# Patient Record
Sex: Female | Born: 1990 | Race: White | Hispanic: No | Marital: Married | Smoking: Former smoker
Health system: Southern US, Community
[De-identification: ages and names within clinical notes are randomized; demographics above are authoritative.]

## PROBLEM LIST (undated history)

## (undated) ENCOUNTER — Inpatient Hospital Stay: Payer: Self-pay

## (undated) DIAGNOSIS — F331 Major depressive disorder, recurrent, moderate: Secondary | ICD-10-CM

## (undated) DIAGNOSIS — U071 COVID-19: Secondary | ICD-10-CM

## (undated) DIAGNOSIS — Z86718 Personal history of other venous thrombosis and embolism: Secondary | ICD-10-CM

## (undated) DIAGNOSIS — A630 Anogenital (venereal) warts: Secondary | ICD-10-CM

## (undated) DIAGNOSIS — C801 Malignant (primary) neoplasm, unspecified: Secondary | ICD-10-CM

## (undated) DIAGNOSIS — F17200 Nicotine dependence, unspecified, uncomplicated: Secondary | ICD-10-CM

## (undated) DIAGNOSIS — Z3490 Encounter for supervision of normal pregnancy, unspecified, unspecified trimester: Secondary | ICD-10-CM

## (undated) DIAGNOSIS — J45909 Unspecified asthma, uncomplicated: Secondary | ICD-10-CM

## (undated) DIAGNOSIS — IMO0001 Reserved for inherently not codable concepts without codable children: Secondary | ICD-10-CM

## (undated) DIAGNOSIS — G43909 Migraine, unspecified, not intractable, without status migrainosus: Secondary | ICD-10-CM

## (undated) HISTORY — DX: Migraine, unspecified, not intractable, without status migrainosus: G43.909

## (undated) HISTORY — DX: Reserved for inherently not codable concepts without codable children: IMO0001

## (undated) HISTORY — DX: Nicotine dependence, unspecified, uncomplicated: F17.200

## (undated) HISTORY — DX: Personal history of other venous thrombosis and embolism: Z86.718

## (undated) HISTORY — DX: Unspecified asthma, uncomplicated: J45.909

## (undated) HISTORY — PX: WISDOM TOOTH EXTRACTION: SHX21

## (undated) HISTORY — DX: Anogenital (venereal) warts: A63.0

## (undated) HISTORY — DX: Major depressive disorder, recurrent, moderate: F33.1

---
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
## 2012-05-31 ENCOUNTER — Ambulatory Visit: Payer: Self-pay | Admitting: Medical

## 2012-06-25 ENCOUNTER — Ambulatory Visit: Payer: Self-pay | Admitting: Emergency Medicine

## 2012-06-25 LAB — URINALYSIS, COMPLETE
Glucose,UR: NEGATIVE mg/dL (ref 0–75)
Nitrite: POSITIVE
Ph: 6 (ref 4.5–8.0)
Protein: 30

## 2012-06-26 LAB — URINE CULTURE

## 2012-09-27 HISTORY — PX: SPLENECTOMY: SUR1306

## 2012-10-21 ENCOUNTER — Ambulatory Visit: Payer: Self-pay

## 2012-11-27 HISTORY — PX: MELANOMA EXCISION: SHX5266

## 2012-12-03 ENCOUNTER — Ambulatory Visit: Payer: Self-pay | Admitting: Internal Medicine

## 2013-08-05 ENCOUNTER — Observation Stay: Payer: Self-pay

## 2013-08-05 LAB — URINALYSIS, COMPLETE
Nitrite: NEGATIVE
Ph: 6 (ref 4.5–8.0)
Protein: 30
Squamous Epithelial: 18
WBC UR: 22 /HPF (ref 0–5)

## 2013-08-05 LAB — FETAL FIBRONECTIN: Fetal Fibronectin: POSITIVE

## 2013-08-06 ENCOUNTER — Inpatient Hospital Stay: Payer: Self-pay | Admitting: Obstetrics and Gynecology

## 2013-08-06 LAB — URINALYSIS, COMPLETE
Nitrite: NEGATIVE
Ph: 6 (ref 4.5–8.0)
Specific Gravity: 1.023 (ref 1.003–1.030)
Squamous Epithelial: 4
WBC UR: 3 /HPF (ref 0–5)

## 2013-08-06 LAB — CBC WITH DIFFERENTIAL/PLATELET
Basophil #: 0.1 10*3/uL (ref 0.0–0.1)
Basophil %: 0.3 %
Eosinophil %: 1.7 %
HCT: 38.7 % (ref 35.0–47.0)
HGB: 13.3 g/dL (ref 12.0–16.0)
Lymphocyte #: 2.9 10*3/uL (ref 1.0–3.6)
MCH: 30.3 pg (ref 26.0–34.0)
MCV: 88 fL (ref 80–100)
Neutrophil #: 15.6 10*3/uL — ABNORMAL HIGH (ref 1.4–6.5)
Neutrophil %: 76.4 %
RBC: 4.41 10*6/uL (ref 3.80–5.20)

## 2013-08-06 LAB — BASIC METABOLIC PANEL
BUN: 5 mg/dL — ABNORMAL LOW (ref 7–18)
Calcium, Total: 8.7 mg/dL (ref 8.5–10.1)
Creatinine: 0.63 mg/dL (ref 0.60–1.30)
EGFR (African American): >60
Glucose: 93 mg/dL (ref 65–99)
Osmolality: 271 (ref 275–301)
Potassium: 3.5 mmol/L (ref 3.5–5.1)
Sodium: 137 mmol/L (ref 136–145)

## 2013-08-07 LAB — MAGNESIUM: Magnesium: 5.7 mg/dL — ABNORMAL HIGH

## 2013-08-08 LAB — BETA STREP CULTURE(ARMC)

## 2013-09-04 ENCOUNTER — Observation Stay: Payer: Self-pay | Admitting: Obstetrics and Gynecology

## 2013-09-04 LAB — URINALYSIS, COMPLETE
Bacteria: NONE SEEN
Blood: NEGATIVE
Glucose,UR: NEGATIVE mg/dL (ref 0–75)
Nitrite: NEGATIVE
Protein: 100
RBC,UR: 1 /HPF (ref 0–5)
Specific Gravity: 1.03 (ref 1.003–1.030)
Squamous Epithelial: 11
WBC UR: 14 /HPF (ref 0–5)

## 2013-09-18 ENCOUNTER — Observation Stay: Payer: Self-pay

## 2013-09-22 ENCOUNTER — Observation Stay: Payer: Self-pay | Admitting: Advanced Practice Midwife

## 2013-09-25 ENCOUNTER — Inpatient Hospital Stay: Payer: Self-pay | Admitting: Obstetrics and Gynecology

## 2013-09-25 LAB — CBC WITH DIFFERENTIAL/PLATELET
Basophil #: 0.1 10*3/uL (ref 0.0–0.1)
Eosinophil #: 0.1 10*3/uL (ref 0.0–0.7)
HGB: 14 g/dL (ref 12.0–16.0)
Lymphocyte %: 20.9 %
MCV: 86 fL (ref 80–100)
Monocyte %: 6.6 %
Neutrophil %: 71.4 %
Platelet: 262 10*3/uL (ref 150–440)
RBC: 4.73 10*6/uL (ref 3.80–5.20)
RDW: 13.3 % (ref 11.5–14.5)
WBC: 17.2 10*3/uL — ABNORMAL HIGH (ref 3.6–11.0)

## 2013-09-26 LAB — CREATININE, SERUM: EGFR (Non-African Amer.): >60

## 2013-09-30 ENCOUNTER — Ambulatory Visit: Payer: Self-pay | Admitting: Pediatrics

## 2013-11-07 LAB — HM PAP SMEAR

## 2014-01-20 ENCOUNTER — Ambulatory Visit: Payer: Self-pay | Admitting: Emergency Medicine

## 2014-01-27 ENCOUNTER — Ambulatory Visit: Payer: Self-pay | Admitting: Neurology

## 2014-04-22 ENCOUNTER — Ambulatory Visit: Payer: Self-pay | Admitting: Physician Assistant

## 2014-04-27 ENCOUNTER — Encounter: Payer: Self-pay | Admitting: Surgery

## 2014-12-15 ENCOUNTER — Ambulatory Visit: Payer: Self-pay | Admitting: Internal Medicine

## 2015-01-12 LAB — LIPID PANEL: HDL: 59 mg/dL (ref 35–70)

## 2015-01-22 LAB — BASIC METABOLIC PANEL
BUN: 11 mg/dL (ref 4–21)
Creatinine: 0.8 mg/dL (ref <=1.1)

## 2015-01-22 LAB — TSH: TSH: 1.36 u[IU]/mL (ref <=5.90)

## 2015-01-22 LAB — LIPID PANEL
CHOLESTEROL: 188 mg/dL (ref 0–200)
Triglycerides: 109 mg/dL (ref 40–160)
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
## 2015-05-04 ENCOUNTER — Other Ambulatory Visit: Payer: Self-pay

## 2015-05-04 DIAGNOSIS — G43009 Migraine without aura, not intractable, without status migrainosus: Secondary | ICD-10-CM | POA: Insufficient documentation

## 2015-05-04 DIAGNOSIS — R102 Pelvic and perineal pain: Secondary | ICD-10-CM | POA: Insufficient documentation

## 2015-05-04 DIAGNOSIS — Z86718 Personal history of other venous thrombosis and embolism: Secondary | ICD-10-CM | POA: Insufficient documentation

## 2015-05-04 DIAGNOSIS — C4361 Malignant melanoma of right upper limb, including shoulder: Secondary | ICD-10-CM | POA: Insufficient documentation

## 2015-05-04 DIAGNOSIS — R002 Palpitations: Secondary | ICD-10-CM | POA: Insufficient documentation

## 2015-05-04 DIAGNOSIS — K439 Ventral hernia without obstruction or gangrene: Secondary | ICD-10-CM | POA: Insufficient documentation

## 2015-05-04 MED ORDER — NORGESTIMATE-ETH ESTRADIOL 0.25-35 MG-MCG PO TABS: 1.0000 | ORAL_TABLET | Freq: Every day | ORAL | Status: DC

## 2015-12-15 ENCOUNTER — Other Ambulatory Visit: Payer: Self-pay

## 2015-12-15 MED ORDER — NORGESTIMATE-ETH ESTRADIOL 0.25-35 MG-MCG PO TABS: 1.0000 | ORAL_TABLET | Freq: Every day | ORAL | Status: DC

## 2015-12-15 NOTE — Telephone Encounter (Signed)
Received fax from pharmacy, requesting medication

## 2016-03-13 ENCOUNTER — Encounter: Payer: Self-pay | Admitting: Internal Medicine

## 2016-03-13 ENCOUNTER — Ambulatory Visit (INDEPENDENT_AMBULATORY_CARE_PROVIDER_SITE_OTHER): Payer: BLUE CROSS/BLUE SHIELD | Admitting: Internal Medicine

## 2016-03-13 VITALS — BP 116/78 | HR 80 | Ht 69.0 in | Wt 201.0 lb

## 2016-03-13 DIAGNOSIS — K629 Disease of anus and rectum, unspecified: Secondary | ICD-10-CM | POA: Diagnosis not present

## 2016-03-13 DIAGNOSIS — K602 Anal fissure, unspecified: Secondary | ICD-10-CM

## 2016-03-13 MED ORDER — HYDROCORTISONE 2.5 % RE CREA: 1.0000 "application " | TOPICAL_CREAM | Freq: Four times a day (QID) | RECTAL | Status: DC
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
## 2016-03-13 NOTE — Patient Instructions (Signed)
Continue Stool Softener 3-4 times per week. Call if no improvement in one week.

## 2016-04-18 ENCOUNTER — Ambulatory Visit (INDEPENDENT_AMBULATORY_CARE_PROVIDER_SITE_OTHER): Payer: BLUE CROSS/BLUE SHIELD | Admitting: Internal Medicine

## 2016-04-18 ENCOUNTER — Encounter: Payer: Self-pay | Admitting: Internal Medicine

## 2016-04-18 VITALS — BP 108/82 | HR 75 | Resp 16 | Ht 69.0 in | Wt 198.0 lb

## 2016-04-18 DIAGNOSIS — F17200 Nicotine dependence, unspecified, uncomplicated: Secondary | ICD-10-CM | POA: Insufficient documentation

## 2016-04-18 DIAGNOSIS — K439 Ventral hernia without obstruction or gangrene: Secondary | ICD-10-CM | POA: Diagnosis not present

## 2016-04-18 DIAGNOSIS — G43009 Migraine without aura, not intractable, without status migrainosus: Secondary | ICD-10-CM

## 2016-04-18 DIAGNOSIS — Z0000 Encounter for general adult medical examination without abnormal findings: Secondary | ICD-10-CM

## 2016-04-18 DIAGNOSIS — C4361 Malignant melanoma of right upper limb, including shoulder: Secondary | ICD-10-CM | POA: Diagnosis not present

## 2016-04-18 DIAGNOSIS — Z124 Encounter for screening for malignant neoplasm of cervix: Secondary | ICD-10-CM | POA: Diagnosis not present

## 2016-04-18 DIAGNOSIS — Z720 Tobacco use: Secondary | ICD-10-CM | POA: Diagnosis not present

## 2016-04-18 LAB — POCT URINALYSIS DIPSTICK
BILIRUBIN UA: NEGATIVE
GLUCOSE UA: NEGATIVE
Ketones, UA: NEGATIVE
Leukocytes, UA: NEGATIVE
NITRITE UA: NEGATIVE
PH UA: 6
Protein, UA: NEGATIVE
SPEC GRAV UA: 1.01
Urobilinogen, UA: 0.2
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
## 2016-04-18 NOTE — Patient Instructions (Signed)
Breast Self-Awareness Practicing breast self-awareness may pick up problems early, prevent significant medical complications, and possibly save your life. By practicing breast self-awareness, you can become familiar with how your breasts look and feel and if your breasts are changing. This allows you to notice changes early. It can also offer you some reassurance that your breast health is good. One way to learn what is normal for your breasts and whether your breasts are changing is to do a breast self-exam. If you find a lump or something that was not present in the past, it is best to contact your caregiver right away. Other findings that should be evaluated by your caregiver include nipple discharge, especially if it is bloody; skin changes or reddening; areas where the skin seems to be pulled in (retracted); or new lumps and bumps. Breast pain is seldom associated with cancer (malignancy), but should also be evaluated by a caregiver. HOW TO PERFORM A BREAST SELF-EXAM The best time to examine your breasts is 5-7 days after your menstrual period is over. During menstruation, the breasts are lumpier, and it may be more difficult to pick up changes. If you do not menstruate, have reached menopause, or had your uterus removed (hysterectomy), you should examine your breasts at regular intervals, such as monthly. If you are breastfeeding, examine your breasts after a feeding or after using a breast pump. Breast implants do not decrease the risk for lumps or tumors, so continue to perform breast self-exams as recommended. Talk to your caregiver about how to determine the difference between the implant and breast tissue. Also, talk about the amount of pressure you should use during the exam. Over time, you will become more familiar with the variations of your breasts and more comfortable with the exam. A breast self-exam requires you to remove all your clothes above the waist. 1. Look at your breasts and nipples.  Stand in front of a mirror in a room with good lighting. With your hands on your hips, push your hands firmly downward. Look for a difference in shape, contour, and size from one breast to the other (asymmetry). Asymmetry includes puckers, dips, or bumps. Also, look for skin changes, such as reddened or scaly areas on the breasts. Look for nipple changes, such as discharge, dimpling, repositioning, or redness. 2. Carefully feel your breasts. This is best done either in the shower or tub while using soapy water or when flat on your back. Place the arm (on the side of the breast you are examining) above your head. Use the pads (not the fingertips) of your three middle fingers on your opposite hand to feel your breasts. Start in the underarm area and use  inch (2 cm) overlapping circles to feel your breast. Use 3 different levels of pressure (light, medium, and firm pressure) at each circle before moving to the next circle. The light pressure is needed to feel the tissue closest to the skin. The medium pressure will help to feel breast tissue a little deeper, while the firm pressure is needed to feel the tissue close to the ribs. Continue the overlapping circles, moving downward over the breast until you feel your ribs below your breast. Then, move one finger-width towards the center of the body. Continue to use the  inch (2 cm) overlapping circles to feel your breast as you move slowly up toward the collar bone (clavicle) near the base of the neck. Continue the up and down exam using all 3 pressures until you reach the   middle of the chest. Do this with each breast, carefully feeling for lumps or changes. 3.  Keep a written record with breast changes or normal findings for each breast. By writing this information down, you do not need to depend only on memory for size, tenderness, or location. Write down where you are in your menstrual cycle, if you are still menstruating. Breast tissue can have some lumps or  thick tissue. However, see your caregiver if you find anything that concerns you.  SEEK MEDICAL CARE IF:  You see a change in shape, contour, or size of your breasts or nipples.   You see skin changes, such as reddened or scaly areas on the breasts or nipples.   You have an unusual discharge from your nipples.   You feel a new lump or unusually thick areas.    This information is not intended to replace advice given to you by your health care provider. Make sure you discuss any questions you have with your health care provider.   Document Released: 11/13/2005 Document Revised: 10/30/2012 Document Reviewed: 02/28/2012 Elsevier Interactive Patient Education 2016 [employer]  

## 2016-04-19 LAB — COMPREHENSIVE METABOLIC PANEL
A/G RATIO: 1.6 (ref 1.2–2.2)
ALT: 11 IU/L (ref 0–32)
AST: 16 IU/L (ref 0–40)
Albumin: 4.3 g/dL (ref 3.5–5.5)
Alkaline Phosphatase: 85 IU/L (ref 39–117)
BILIRUBIN TOTAL: 0.2 mg/dL (ref 0.0–1.2)
BUN/Creatinine Ratio: 16 (ref 9–23)
BUN: 12 mg/dL (ref 6–20)
CHLORIDE: 97 mmol/L (ref 96–106)
CO2: 23 mmol/L (ref 18–29)
Calcium: 9.4 mg/dL (ref 8.7–10.2)
Creatinine, Ser: 0.75 mg/dL (ref 0.57–1.00)
GFR calc Af Amer: 130 mL/min/{1.73_m2} (ref >59)
GFR calc non Af Amer: 113 mL/min/{1.73_m2} (ref >59)
GLUCOSE: 70 mg/dL (ref 65–99)
Globulin, Total: 2.7 g/dL (ref 1.5–4.5)
POTASSIUM: 4.1 mmol/L (ref 3.5–5.2)
Sodium: 137 mmol/L (ref 134–144)
Total Protein: 7 g/dL (ref 6.0–8.5)

## 2016-04-19 LAB — LIPID PANEL
Chol/HDL Ratio: 4.5 ratio units — ABNORMAL HIGH (ref 0.0–4.4)
Cholesterol, Total: 185 mg/dL (ref 100–199)
HDL: 41 mg/dL (ref >39)
LDL Calculated: 90 mg/dL (ref 0–99)
Triglycerides: 271 mg/dL — ABNORMAL HIGH (ref 0–149)
VLDL Cholesterol Cal: 54 mg/dL — ABNORMAL HIGH (ref 5–40)

## 2016-04-19 LAB — CBC WITH DIFFERENTIAL/PLATELET
Basophils Absolute: 0.1 10*3/uL (ref 0.0–0.2)
Basos: 0 %
EOS (ABSOLUTE): 0.6 10*3/uL — ABNORMAL HIGH (ref 0.0–0.4)
Eos: 5 %
Hematocrit: 42.6 % (ref 34.0–46.6)
Hemoglobin: 14.3 g/dL (ref 11.1–15.9)
Immature Grans (Abs): 0 10*3/uL (ref 0.0–0.1)
Immature Granulocytes: 0 %
Lymphocytes Absolute: 4.2 10*3/uL — ABNORMAL HIGH (ref 0.7–3.1)
Lymphs: 36 %
MCH: 28.4 pg (ref 26.6–33.0)
MCHC: 33.6 g/dL (ref 31.5–35.7)
MCV: 85 fL (ref 79–97)
Monocytes Absolute: 0.9 10*3/uL (ref 0.1–0.9)
Monocytes: 8 %
Neutrophils Absolute: 5.9 10*3/uL (ref 1.4–7.0)
Neutrophils: 51 %
Platelets: 351 10*3/uL (ref 150–379)
RBC: 5.04 x10E6/uL (ref 3.77–5.28)
RDW: 14.5 % (ref 12.3–15.4)
WBC: 11.8 10*3/uL — ABNORMAL HIGH (ref 3.4–10.8)

## 2016-04-19 LAB — TSH: TSH: 1.38 u[IU]/mL (ref 0.450–4.500)

## 2016-04-20 LAB — PAP IG AND HPV HIGH-RISK
HPV, high-risk: NEGATIVE
PAP Smear Comment: 0

## 2016-05-12 ENCOUNTER — Telehealth: Payer: Self-pay

## 2016-05-12 NOTE — Telephone Encounter (Signed)
These 2 types of OCPs have exactly the same composition. I do not think changing brand would have any effect of current issues.  I suggest consultation with GYN if problems are persistent.

## 2016-05-12 NOTE — Telephone Encounter (Signed)
Patient having periods every other week and sometimes blood is dark brown as if old blood. Patient swapped from [facility] to [facility].  Advised on VM to call back and get girls to schedule OV and if any chills or abdominal pain or anything else arises go to [place] or ER.

## 2016-05-16 NOTE — Telephone Encounter (Signed)
Advised 

## 2016-05-17 ENCOUNTER — Ambulatory Visit: Payer: BLUE CROSS/BLUE SHIELD | Admitting: Internal Medicine

## 2016-08-28 ENCOUNTER — Encounter: Payer: Self-pay | Admitting: Emergency Medicine

## 2016-08-28 ENCOUNTER — Ambulatory Visit
Admission: EM | Admit: 2016-08-28 | Discharge: 2016-08-28 | Disposition: A | Discharge status: Home / Self Care | Payer: BLUE CROSS/BLUE SHIELD | Attending: Family Medicine | Admitting: Family Medicine

## 2016-08-28 DIAGNOSIS — Z3201 Encounter for pregnancy test, result positive: Secondary | ICD-10-CM

## 2016-08-28 LAB — PREGNANCY, URINE: PREG TEST UR: POSITIVE — AB

## 2016-08-28 NOTE — ED Triage Notes (Signed)
Patient had pregnancy test at home positive.  Patient here for confirmation.

## 2016-08-28 NOTE — Discharge Instructions (Signed)
Began taking prenatal vitamins.  Call today to schedule a GYN follow-up with your [facility].  Follow up with your primary care physician this week as needed. Return to Urgent care for new or worsening concerns.
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
## 2016-09-02 ENCOUNTER — Observation Stay
Admission: EM | Admit: 2016-09-02 | Discharge: 2016-09-03 | Disposition: A | Discharge status: Home / Self Care | Payer: BLUE CROSS/BLUE SHIELD | Attending: Specialist | Admitting: Specialist

## 2016-09-02 ENCOUNTER — Encounter: Payer: Self-pay | Admitting: Emergency Medicine

## 2016-09-02 ENCOUNTER — Emergency Department: Payer: BLUE CROSS/BLUE SHIELD

## 2016-09-02 DIAGNOSIS — G43009 Migraine without aura, not intractable, without status migrainosus: Secondary | ICD-10-CM | POA: Diagnosis not present

## 2016-09-02 DIAGNOSIS — Z3A01 Less than 8 weeks gestation of pregnancy: Secondary | ICD-10-CM | POA: Insufficient documentation

## 2016-09-02 DIAGNOSIS — Z888 Allergy status to other drugs, medicaments and biological substances status: Secondary | ICD-10-CM | POA: Insufficient documentation

## 2016-09-02 DIAGNOSIS — Z86718 Personal history of other venous thrombosis and embolism: Secondary | ICD-10-CM | POA: Diagnosis not present

## 2016-09-02 DIAGNOSIS — F17210 Nicotine dependence, cigarettes, uncomplicated: Secondary | ICD-10-CM | POA: Insufficient documentation

## 2016-09-02 DIAGNOSIS — Z85820 Personal history of malignant melanoma of skin: Secondary | ICD-10-CM | POA: Diagnosis not present

## 2016-09-02 DIAGNOSIS — O99331 Smoking (tobacco) complicating pregnancy, first trimester: Secondary | ICD-10-CM | POA: Insufficient documentation

## 2016-09-02 DIAGNOSIS — O99351 Diseases of the nervous system complicating pregnancy, first trimester: Secondary | ICD-10-CM | POA: Diagnosis not present

## 2016-09-02 DIAGNOSIS — H539 Unspecified visual disturbance: Secondary | ICD-10-CM

## 2016-09-02 DIAGNOSIS — Z3490 Encounter for supervision of normal pregnancy, unspecified, unspecified trimester: Secondary | ICD-10-CM

## 2016-09-02 HISTORY — DX: Encounter for supervision of normal pregnancy, unspecified, unspecified trimester: Z34.90

## 2016-09-02 LAB — COMPREHENSIVE METABOLIC PANEL
ALT: 17 U/L (ref 14–54)
AST: 23 U/L (ref 15–41)
Albumin: 3.7 g/dL (ref 3.5–5.0)
Alkaline Phosphatase: 85 U/L (ref 38–126)
Anion gap: 7 (ref 5–15)
BUN: 11 mg/dL (ref 6–20)
CHLORIDE: 105 mmol/L (ref 101–111)
CO2: 22 mmol/L (ref 22–32)
CREATININE: 0.69 mg/dL (ref 0.44–1.00)
Calcium: 9 mg/dL (ref 8.9–10.3)
GFR calc non Af Amer: >60 mL/min (ref >60)
Glucose, Bld: 89 mg/dL (ref 65–99)
Potassium: 3.6 mmol/L (ref 3.5–5.1)
SODIUM: 134 mmol/L — AB (ref 135–145)
Total Bilirubin: 0.6 mg/dL (ref 0.3–1.2)
Total Protein: 7.3 g/dL (ref 6.5–8.1)

## 2016-09-02 LAB — TSH: TSH: 1.881 u[IU]/mL (ref 0.350–4.500)

## 2016-09-02 LAB — URINALYSIS COMPLETE WITH MICROSCOPIC (ARMC ONLY)
BACTERIA UA: NONE SEEN
Bilirubin Urine: NEGATIVE
Glucose, UA: NEGATIVE mg/dL
HGB URINE DIPSTICK: NEGATIVE
Ketones, ur: NEGATIVE mg/dL
LEUKOCYTES UA: NEGATIVE
NITRITE: NEGATIVE
Protein, ur: NEGATIVE mg/dL
SPECIFIC GRAVITY, URINE: 1.017 (ref 1.005–1.030)
pH: 6 (ref 5.0–8.0)

## 2016-09-02 LAB — ANTITHROMBIN III: AntiThromb III Func: 85 % (ref 75–120)

## 2016-09-02 LAB — DIFFERENTIAL
BASOS ABS: 0.2 10*3/uL — AB (ref 0–0.1)
BASOS PCT: 2 %
Eosinophils Absolute: 0.6 10*3/uL (ref 0–0.7)
Eosinophils Relative: 5 %
Lymphocytes Relative: 33 %
Lymphs Abs: 4 10*3/uL — ABNORMAL HIGH (ref 1.0–3.6)
MONO ABS: 2 10*3/uL — AB (ref 0.2–0.9)
Monocytes Relative: 17 %
NEUTROS ABS: 5.4 10*3/uL (ref 1.4–6.5)
Neutrophils Relative %: 43 %

## 2016-09-02 LAB — CBC
HCT: 42.4 % (ref 35.0–47.0)
Hemoglobin: 14.8 g/dL (ref 12.0–16.0)
MCH: 28.8 pg (ref 26.0–34.0)
MCHC: 34.9 g/dL (ref 32.0–36.0)
MCV: 82.5 fL (ref 80.0–100.0)
PLATELETS: 335 10*3/uL (ref 150–440)
RBC: 5.14 MIL/uL (ref 3.80–5.20)
RDW: 14.1 % (ref 11.5–14.5)
WBC: 12.4 10*3/uL — AB (ref 3.6–11.0)

## 2016-09-02 LAB — PROTIME-INR
INR: 1.03
PROTHROMBIN TIME: 13.5 s (ref 11.4–15.2)

## 2016-09-02 LAB — APTT: APTT: 30 s (ref 24–36)

## 2016-09-02 LAB — GLUCOSE, CAPILLARY: Glucose-Capillary: 88 mg/dL (ref 65–99)

## 2016-09-02 LAB — HCG, QUANTITATIVE, PREGNANCY: hCG, Beta Chain, Quant, S: 6915 m[IU]/mL — ABNORMAL HIGH (ref <5)

## 2016-09-02 MED ORDER — ALBUTEROL SULFATE (2.5 MG/3ML) 0.083% IN NEBU: 3.0000 mL | INHALATION_SOLUTION | RESPIRATORY_TRACT | Status: DC | PRN

## 2016-09-02 MED ORDER — MORPHINE SULFATE (PF) 2 MG/ML IV SOLN
2.0000 mg | Freq: Once | INTRAVENOUS | Status: AC
  Administered 2016-09-02: 2 mg via INTRAVENOUS
  Filled 2016-09-02: qty 1

## 2016-09-02 MED ORDER — ONDANSETRON HCL 4 MG/2ML IJ SOLN
4.0000 mg | Freq: Once | INTRAMUSCULAR | Status: AC
  Administered 2016-09-02: 4 mg via INTRAVENOUS
  Filled 2016-09-02: qty 2

## 2016-09-02 MED ORDER — SODIUM CHLORIDE 0.9 % IV SOLN
INTRAVENOUS | Status: DC
  Administered 2016-09-02 – 2016-09-03 (×2): via INTRAVENOUS

## 2016-09-02 MED ORDER — MORPHINE SULFATE (PF) 2 MG/ML IV SOLN: 1.0000 mg | INTRAVENOUS | Status: DC | PRN

## 2016-09-02 MED ORDER — ACETAMINOPHEN 325 MG PO TABS: 650.0000 mg | ORAL_TABLET | Freq: Four times a day (QID) | ORAL | Status: DC | PRN

## 2016-09-02 MED ORDER — ACETAMINOPHEN 650 MG RE SUPP: 650.0000 mg | Freq: Four times a day (QID) | RECTAL | Status: DC | PRN

## 2016-09-02 MED ORDER — SODIUM CHLORIDE 0.9% FLUSH: 3.0000 mL | Freq: Two times a day (BID) | INTRAVENOUS | Status: DC

## 2016-09-02 MED ORDER — SODIUM CHLORIDE 0.9 % IV BOLUS (SEPSIS)
1000.0000 mL | Freq: Once | INTRAVENOUS | Status: AC
  Administered 2016-09-02: 1000 mL via INTRAVENOUS

## 2016-09-02 MED ORDER — PRENATAL MULTIVITAMIN CH
1.0000 | ORAL_TABLET | Freq: Every day | ORAL | Status: DC
  Administered 2016-09-03: 12:00:00 1 via ORAL
  Filled 2016-09-02: qty 1

## 2016-09-02 NOTE — ED Triage Notes (Addendum)
Pt reports 1 HR ago loss of right visual field from right eye. Reports vision is completely gone. Vision did return but then went away again. Pt reports [redacted] wks pregnant. Mild pain to left side of head.

## 2016-09-02 NOTE — ED Notes (Signed)
Report called to floor, given to [facility], Therapist, sports.

## 2016-09-02 NOTE — ED Triage Notes (Signed)
CT head ordered per dr Archie Balboa
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
## 2016-09-02 NOTE — ED Triage Notes (Signed)
Per CT, MD must go over risk of CT with pt prior to scan.  Will go over once in room per MD

## 2016-09-03 ENCOUNTER — Observation Stay: Payer: BLUE CROSS/BLUE SHIELD

## 2016-09-03 ENCOUNTER — Encounter: Payer: Self-pay | Admitting: Internal Medicine

## 2016-09-03 DIAGNOSIS — Z3490 Encounter for supervision of normal pregnancy, unspecified, unspecified trimester: Secondary | ICD-10-CM

## 2016-09-03 DIAGNOSIS — H539 Unspecified visual disturbance: Secondary | ICD-10-CM

## 2016-09-03 LAB — CBC
HCT: 37.1 % (ref 35.0–47.0)
Hemoglobin: 12.8 g/dL (ref 12.0–16.0)
MCH: 28.6 pg (ref 26.0–34.0)
MCHC: 34.6 g/dL (ref 32.0–36.0)
MCV: 82.6 fL (ref 80.0–100.0)
PLATELETS: 302 10*3/uL (ref 150–440)
RBC: 4.49 MIL/uL (ref 3.80–5.20)
RDW: 14.2 % (ref 11.5–14.5)
WBC: 13.6 10*3/uL — AB (ref 3.6–11.0)

## 2016-09-03 LAB — BASIC METABOLIC PANEL
Anion gap: 6 (ref 5–15)
BUN: 10 mg/dL (ref 6–20)
CO2: 22 mmol/L (ref 22–32)
CREATININE: 0.73 mg/dL (ref 0.44–1.00)
Calcium: 8.1 mg/dL — ABNORMAL LOW (ref 8.9–10.3)
Chloride: 107 mmol/L (ref 101–111)
GFR calc Af Amer: >60 mL/min (ref >60)
Glucose, Bld: 91 mg/dL (ref 65–99)
Potassium: 3.7 mmol/L (ref 3.5–5.1)
SODIUM: 135 mmol/L (ref 135–145)

## 2016-09-03 MED ORDER — GUAIFENESIN-DM 100-10 MG/5ML PO SYRP
5.0000 mL | ORAL_SOLUTION | Freq: Four times a day (QID) | ORAL | Status: DC | PRN
  Administered 2016-09-03 (×2): 5 mL via ORAL
  Filled 2016-09-03 (×2): qty 5

## 2016-09-03 NOTE — Progress Notes (Signed)
17:38 - Patient discharged - all instructions reviewed. Wheeled to door with husband.
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
## 2016-09-03 NOTE — Progress Notes (Signed)
Spoke to dr Marcille Blanco about patient blood pressure 81/50 md order to monitor no orders at this time

## 2016-09-03 NOTE — Progress Notes (Signed)
1700: Patient had MRI of head. Dr. Verdell Carmine called to review results. Patient may be discharged.

## 2016-09-04 LAB — HOMOCYSTEINE: HOMOCYSTEINE-NORM: 5.9 umol/L (ref 0.0–15.0)

## 2016-09-04 NOTE — Discharge Summary (Signed)
[place] [facility] [place] NAME: Erika Barnes    MR#:  [id number]  DATE OF BIRTH:  [birth year]  DATE OF ADMISSION:  09/02/2016 ADMITTING PHYSICIAN: Harvie Bridge, DO  DATE OF DISCHARGE: 09/03/2016  5:39 PM  PRIMARY CARE PHYSICIAN: Halina Maidens, MD    ADMISSION DIAGNOSIS:  Vision changes [H53.9]  DISCHARGE DIAGNOSIS:  Active Problems:   Vision abnormalities   Pregnancy   SECONDARY DIAGNOSIS:   Past Medical History:  Diagnosis Date  . Birth control   . Migraines   . Pregnant   . Smoker    NCQUIT Info given 04/18/2016    HOSPITAL COURSE:   44 female with past medical history of migraines presented to the hospital due to vision loss and speech disturbance and some right-sided weakness.  1. Complicated migraine-this was the cause of patient's neurologic symptoms as mentioned above. -She was admitted to the hospital and underwent an extensive workup including a CT head, MRI MRA of the brain which was essentially normal and negative for any acute neurologic event. She was seen by neurology who thought that her symptoms were probably related to her, get migraine. -Her clinical symptoms have not improved and therefore she was discharged home with a negative neurologic workup.  2. 5 week intrauterine pregnancy-patient will be referred to OB/GYN as an outpatient -She will continue her prenatal vitamins.  DISCHARGE CONDITIONS:   Stable  CONSULTS OBTAINED:  Treatment Team:  Alexis Goodell, MD Will Bonnet, MD  DRUG ALLERGIES:   Allergies  Allergen Reactions  . Tape Other (See Comments)    erythema    DISCHARGE MEDICATIONS:     Medication List    TAKE these medications   albuterol 108 (90 Base) MCG/ACT inhaler Commonly known as:  PROVENTIL HFA;VENTOLIN HFA Inhale 1 puff into the lungs every 4 (four) hours as needed for wheezing or shortness of breath.   MUCINEX 600 MG 12 hr tablet Generic drug:   guaiFENesin Take by mouth 2 (two) times daily.   prenatal multivitamin Tabs tablet Take 1 tablet by mouth daily at 12 noon.         DISCHARGE INSTRUCTIONS:   DIET:  Regular diet  DISCHARGE CONDITION:  Stable  ACTIVITY:  Activity as tolerated  OXYGEN:  Home Oxygen: No.   Oxygen Delivery: room air  DISCHARGE LOCATION:  home   If you experience worsening of your admission symptoms, develop shortness of breath, life threatening emergency, suicidal or homicidal thoughts you must seek medical attention immediately by calling 911 or calling your MD immediately  if symptoms less severe.  You Must read complete instructions/literature along with all the possible adverse reactions/side effects for all the Medicines you take and that have been prescribed to you. Take any new Medicines after you have completely understood and accpet all the possible adverse reactions/side effects.   Please note  You were cared for by a hospitalist during your hospital stay. If you have any questions about your discharge medications or the care you received while you were in the hospital after you are discharged, you can call the unit and asked to speak with the hospitalist on call if the hospitalist that took care of you is not available. Once you are discharged, your primary care physician will handle any further medical issues. Please note that NO REFILLS for any discharge medications will be authorized once you are discharged, as it is imperative that you return to your primary care physician (or establish a  relationship with a primary care physician if you do not have one) for your aftercare needs so that they can reassess your need for medications and monitor your lab values.     Today   No headache, nausea, vomiting. Right-sided weakness improved. No further speech disturbance. Vision is back to normal.  VITAL SIGNS:  Blood pressure 110/66, pulse 75, temperature 97.8 F (36.6 C), temperature  source Oral, resp. rate 20, height 5\' 9"  (1.753 m), weight 89.4 kg (197 lb 3 oz), last menstrual period 07/24/2016, SpO2 97 %.  I/O:  No intake or output data in the 24 hours ending 09/04/16 1510  PHYSICAL EXAMINATION:  GENERAL:  25 y.o.-year-old patient lying in the bed with no acute distress.  EYES: Pupils equal, round, reactive to light and accommodation. No scleral icterus. Extraocular muscles intact.  HEENT: Head atraumatic, normocephalic. Oropharynx and nasopharynx clear.  NECK:  Supple, no jugular venous distention. No thyroid enlargement, no tenderness.  LUNGS: Normal breath sounds bilaterally, no wheezing, rales,rhonchi. No use of accessory muscles of respiration.  CARDIOVASCULAR: S1, S2 normal. No murmurs, rubs, or gallops.  ABDOMEN: Soft, non-tender, non-distended. Bowel sounds present. No organomegaly or mass.  EXTREMITIES: No pedal edema, cyanosis, or clubbing.  NEUROLOGIC: Cranial nerves II through XII are intact. No focal motor or sensory defecits b/l.  PSYCHIATRIC: The patient is alert and oriented x 3. Good affect.  SKIN: No obvious rash, lesion, or ulcer.   DATA REVIEW:   CBC  Recent Labs Lab 09/03/16 0446  WBC 13.6*  HGB 12.8  HCT 37.1  PLT 302    Chemistries   Recent Labs Lab 09/02/16 1730 09/03/16 0446  NA 134* 135  K 3.6 3.7  CL 105 107  CO2 22 22  GLUCOSE 89 91  BUN 11 10  CREATININE 0.69 0.73  CALCIUM 9.0 8.1*  AST 23  --   ALT 17  --   ALKPHOS 85  --   BILITOT 0.6  --     Cardiac Enzymes No results for input(s): TROPONINI in the last 168 hours.   RADIOLOGY:  Ct Head Wo Contrast  Result Date: 09/02/2016 CLINICAL DATA:  Vision loss in RIGHT eye 2 hours ago. History of migraines. EXAM: CT HEAD WITHOUT CONTRAST TECHNIQUE: Contiguous axial images were obtained from the base of the skull through the vertex without intravenous contrast. COMPARISON:  MR brain 01/27/2014. FINDINGS: Brain: No evidence of acute infarction, hemorrhage,  hydrocephalus, extra-axial collection or mass lesion/mass effect. Normal for age cerebral volume. No white matter disease. Vascular:  No hyperdense vessel.  No unexpected calcification. Skull: Normal. Negative for fracture or focal lesion. Sinuses/Orbits: Negative visualized orbits. Slight layering fluid in the LEFT division sphenoid, suggesting acuity. Other: None.  Compared with prior MR, the appearance is similar. IMPRESSION: No acute or focal intracranial abnormality. No abnormality is seen contributing to RIGHT eye visual loss. Slight layering fluid LEFT division sphenoid, uncertain significance. Correlate clinically for acute sinus disease. These results were called by telephone at the time of interpretation on 09/02/2016 at 5:25 pm to Dr. Edd Fabian , who verbally acknowledged these results. Electronically Signed   By: Staci Righter M.D.   On: 09/02/2016 17:27   Mr Jodene Nam Head Wo Contrast  Result Date: 09/03/2016 CLINICAL DATA:  History of migraine headaches. Five weeks pregnant. Severe left-sided headaches and visual disturbance. EXAM: MRI HEAD WITHOUT CONTRAST MRA HEAD WITHOUT CONTRAST TECHNIQUE: Multiplanar, multiecho pulse sequences of the brain and surrounding structures were obtained without intravenous contrast. Angiographic images of  the head were obtained using MRA technique without contrast. COMPARISON:  CT 09/02/2016.  MRI 01/27/2014. FINDINGS: MRI HEAD FINDINGS Brain: Normal appearance without evidence of malformation, atrophy, old or acute infarction, mass lesion, hemorrhage, hydrocephalus or extra-axial collection. Vascular: Major vessels at the base of the brain show flow. Skull and upper cervical spine: Negative Sinuses/Orbits: Fluid level in the left sphenoid sinus suggesting acute sinusitis. This could be symptomatic. Mild mucosal thickening along the floor of the right maxillary sinus. Other: None significant MRA HEAD FINDINGS Both internal carotid arteries are widely patent into the brain. No  siphon stenosis. The anterior and middle cerebral vessels are patent without proximal stenosis, aneurysm or vascular malformation. Both vertebral arteries are widely patent to the basilar. No basilar stenosis. Incidental and insignificant basilar fenestration. Posterior circulation branch vessels appear normal. IMPRESSION: Normal MRI of the brain itself. Fluid level in the left sphenoid sinus suggesting acute sinusitis. This could be associated with headache. Normal intracranial MR angiography. Electronically Signed   By: Nelson Chimes M.D.   On: 09/03/2016 16:10   Mr Brain Wo Contrast  Result Date: 09/03/2016 CLINICAL DATA:  History of migraine headaches. Five weeks pregnant. Severe left-sided headaches and visual disturbance. EXAM: MRI HEAD WITHOUT CONTRAST MRA HEAD WITHOUT CONTRAST TECHNIQUE: Multiplanar, multiecho pulse sequences of the brain and surrounding structures were obtained without intravenous contrast. Angiographic images of the head were obtained using MRA technique without contrast. COMPARISON:  CT 09/02/2016.  MRI 01/27/2014. FINDINGS: MRI HEAD FINDINGS Brain: Normal appearance without evidence of malformation, atrophy, old or acute infarction, mass lesion, hemorrhage, hydrocephalus or extra-axial collection. Vascular: Major vessels at the base of the brain show flow. Skull and upper cervical spine: Negative Sinuses/Orbits: Fluid level in the left sphenoid sinus suggesting acute sinusitis. This could be symptomatic. Mild mucosal thickening along the floor of the right maxillary sinus. Other: None significant MRA HEAD FINDINGS Both internal carotid arteries are widely patent into the brain. No siphon stenosis. The anterior and middle cerebral vessels are patent without proximal stenosis, aneurysm or vascular malformation. Both vertebral arteries are widely patent to the basilar. No basilar stenosis. Incidental and insignificant basilar fenestration. Posterior circulation branch vessels appear  normal. IMPRESSION: Normal MRI of the brain itself. Fluid level in the left sphenoid sinus suggesting acute sinusitis. This could be associated with headache. Normal intracranial MR angiography. Electronically Signed   By: Nelson Chimes M.D.   On: 09/03/2016 16:10   US Carotid Bilateral  Result Date: 09/03/2016 CLINICAL DATA:  25 year old female with a history of vision changes. Cardiovascular risk factors include tobacco use EXAM: BILATERAL CAROTID DUPLEX ULTRASOUND TECHNIQUE: Pearline Cables scale imaging, color Doppler and duplex ultrasound were performed of bilateral carotid and vertebral arteries in the neck. COMPARISON:  No prior duplex FINDINGS: Criteria: Quantification of carotid stenosis is based on velocity parameters that correlate the residual internal carotid diameter with NASCET-based stenosis levels, using the diameter of the distal internal carotid lumen as the denominator for stenosis measurement. The following velocity measurements were obtained: RIGHT ICA:  Systolic 96 cm/sec, Diastolic 24 cm/sec CCA:  [id number] cm/sec SYSTOLIC ICA/CCA RATIO:  0.8 ECA:  101 cm/sec LEFT ICA:  Systolic 89 cm/sec, Diastolic 25 cm/sec CCA:  [id number] a cm/sec SYSTOLIC ICA/CCA RATIO:  0.6 ECA:  150 cm/sec Right Brachial SBP: Not acquired Left Brachial SBP: Not acquired RIGHT CAROTID ARTERY: Unremarkable appearance of the carotid system without significant atherosclerotic disease. Intermediate waveform of the CCA. Low resistance waveform of the ICA. No significant tortuosity of the carotid  system. RIGHT VERTEBRAL ARTERY: Antegrade flow with low resistance waveform. LEFT CAROTID ARTERY: Unremarkable appearance of the carotid system without significant atherosclerotic disease. Intermediate waveform of the CCA. Low resistance waveform of the ICA. No significant tortuosity of the carotid system. LEFT VERTEBRAL ARTERY:  Antegrade flow with low resistance waveform. IMPRESSION: Color duplex indicates no significant plaque, with no hemodynamically  significant stenosis by duplex criteria in the extracranial cerebrovascular circulation. Signed, Dulcy Fanny. Earleen Newport, DO [facility] Electronically Signed   By: Corrie Mckusick D.O.   On: 09/03/2016 09:49      Management plans discussed with the patient, family and they are in agreement.  CODE STATUS:  Code Status History    Date Active Date Inactive Code Status Order ID Comments User Context   09/02/2016  8:48 PM 09/03/2016  8:39 PM Full Code [id number]  Harvie Bridge, DO Inpatient      TOTAL TIME TAKING CARE OF THIS PATIENT: 40 minutes.    Henreitta Leber M.D on 09/04/2016 at 3:10 PM  Between 7am to 6pm - Pager - [phone]  After 6pm go to [email or web address] - Proofreader  [facility]  Office  [phone]  CC: Primary care physician; Halina Maidens, MD

## 2016-09-05 LAB — LUPUS ANTICOAGULANT PANEL
DRVVT: 38.5 s (ref 0.0–47.0)
PTT Lupus Anticoagulant: 40.5 s (ref 0.0–51.9)

## 2016-09-05 LAB — PROTEIN C ACTIVITY: Protein C Activity: 93 % (ref 73–180)

## 2016-09-05 LAB — PROTEIN S, TOTAL: PROTEIN S AG TOTAL: 75 % (ref 60–150)

## 2016-09-05 LAB — PROTEIN S ACTIVITY: PROTEIN S ACTIVITY: 72 % (ref 63–140)

## 2016-09-05 LAB — PROTEIN C, TOTAL: PROTEIN C, TOTAL: 86 % (ref 60–150)

## 2016-09-06 LAB — FACTOR 5 LEIDEN

## 2016-09-08 LAB — PROTHROMBIN GENE MUTATION

## 2016-09-12 LAB — BETA-2-GLYCOPROTEIN I ABS, IGG/M/A: Beta-2 Glyco I IgG: <9 GPI IgG units (ref 0–20)

## 2016-09-13 LAB — OB RESULTS CONSOLE HGB/HCT, BLOOD
HCT: 43 %
Hemoglobin: 14.6 g/dL

## 2016-09-13 LAB — OB RESULTS CONSOLE HIV ANTIBODY (ROUTINE TESTING): HIV: NONREACTIVE

## 2016-09-13 LAB — OB RESULTS CONSOLE GC/CHLAMYDIA
Chlamydia: NEGATIVE
Gonorrhea: NEGATIVE

## 2016-09-13 LAB — OB RESULTS CONSOLE ABO/RH: RH TYPE: POSITIVE

## 2016-09-13 LAB — CARDIOLIPIN ANTIBODIES, IGG, IGM, IGA: ANTICARDIOLIPIN IGM: 10 [MPL'U]/mL (ref 0–12)

## 2016-09-13 LAB — OB RESULTS CONSOLE PLATELET COUNT: PLATELETS: 402 10*3/uL

## 2016-09-13 LAB — OB RESULTS CONSOLE RUBELLA ANTIBODY, IGM: RUBELLA: IMMUNE

## 2016-09-13 LAB — OB RESULTS CONSOLE ANTIBODY SCREEN: Antibody Screen: NEGATIVE

## 2016-09-13 LAB — OB RESULTS CONSOLE RPR: RPR: NONREACTIVE

## 2016-09-13 LAB — OB RESULTS CONSOLE VARICELLA ZOSTER ANTIBODY, IGG: VARICELLA IGG: IMMUNE

## 2016-09-13 LAB — OB RESULTS CONSOLE HEPATITIS B SURFACE ANTIGEN: HEP B S AG: NEGATIVE

## 2016-10-22 ENCOUNTER — Ambulatory Visit
Admission: EM | Admit: 2016-10-22 | Discharge: 2016-10-22 | Disposition: A | Discharge status: Home / Self Care | Payer: BLUE CROSS/BLUE SHIELD | Attending: Emergency Medicine | Admitting: Emergency Medicine

## 2016-10-22 ENCOUNTER — Encounter: Payer: Self-pay | Admitting: Gynecology

## 2016-10-22 DIAGNOSIS — J069 Acute upper respiratory infection, unspecified: Secondary | ICD-10-CM | POA: Diagnosis not present

## 2016-10-22 LAB — URINALYSIS COMPLETE WITH MICROSCOPIC (ARMC ONLY)
Glucose, UA: NEGATIVE mg/dL
KETONES UR: 15 mg/dL — AB
LEUKOCYTES UA: NEGATIVE
Nitrite: NEGATIVE
PH: 6 (ref 5.0–8.0)
Specific Gravity, Urine: 1.025 (ref 1.005–1.030)

## 2016-10-22 LAB — RAPID STREP SCREEN (MED CTR MEBANE ONLY): Streptococcus, Group A Screen (Direct): NEGATIVE

## 2016-10-22 MED ORDER — ALBUTEROL SULFATE HFA 108 (90 BASE) MCG/ACT IN AERS: 1.0000 | INHALATION_SPRAY | Freq: Four times a day (QID) | RESPIRATORY_TRACT | 0 refills | Status: DC | PRN

## 2016-10-22 MED ORDER — ONDANSETRON 8 MG PO TBDP: 8.0000 mg | ORAL_TABLET | Freq: Two times a day (BID) | ORAL | 0 refills | Status: DC

## 2016-10-22 MED ORDER — BUDESONIDE 32 MCG/ACT NA SUSP: 2.0000 | Freq: Every day | NASAL | 0 refills | Status: DC

## 2016-10-22 NOTE — ED Triage Notes (Signed)
Patient stated [redacted] weeks pregnant. Per patient cough symptoms x 2-3 weeks. Per patient not sure if she has a UTI.
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
## 2016-10-25 LAB — CULTURE, GROUP A STREP (THRC)

## 2016-10-26 ENCOUNTER — Telehealth: Payer: Self-pay | Admitting: *Deleted

## 2016-10-26 NOTE — Telephone Encounter (Signed)
Called patient, verified DOB, communicated negative strep culture result. Patient reported feeling the same. Advised patient to follow up with PCP or [place] for further treatment.

## 2016-11-27 NOTE — L&D Delivery Note (Signed)
Delivery Note At 10:19 PM a viable female was delivered via Vaginal, Spontaneous Delivery (Presentation:ROA ).  APGAR: 9, 9; weight pending.   Placenta status: spontaneous, intact.  Cord: 3VC  withoutcomplications.  Cord pH: N/A  Anesthesia:  Epidural Episiotomy: None Lacerations: None Suture Repair: N/A Est. Blood Loss (mL): 200  Mom to postpartum.  Baby to Couplet care / Skin to Skin.  Malachy Mood 05/07/2017, 10:44 PM

## 2016-12-14 ENCOUNTER — Encounter: Payer: Self-pay | Admitting: *Deleted

## 2016-12-14 ENCOUNTER — Observation Stay
Admission: EM | Admit: 2016-12-14 | Discharge: 2016-12-14 | Disposition: A | Discharge status: Home / Self Care | Payer: BLUE CROSS/BLUE SHIELD | Attending: Obstetrics and Gynecology | Admitting: Obstetrics and Gynecology

## 2016-12-14 DIAGNOSIS — O26892 Other specified pregnancy related conditions, second trimester: Secondary | ICD-10-CM | POA: Diagnosis not present

## 2016-12-14 DIAGNOSIS — Z3A20 20 weeks gestation of pregnancy: Secondary | ICD-10-CM | POA: Insufficient documentation

## 2016-12-14 DIAGNOSIS — R197 Diarrhea, unspecified: Secondary | ICD-10-CM | POA: Diagnosis present

## 2016-12-14 NOTE — Final Progress Note (Signed)
Physician Final Progress Note  Patient ID: Erika Barnes MRN: [id number] DOB/AGE: [birth year] 26 y.o.  Admit date: 12/14/2016 Admitting provider: Malachy Mood, MD Discharge date: 12/14/2016   Admission Diagnoses: Diarrhea  Discharge Diagnoses:  Active Problems:   Diarrhea  26 yo G2P1001 at [redacted]w[redacted]d presenting with diarrhea x 2 this morning, preceded abdominal cramping.  Resolved post bowl movement.  No further loose stools.  +FHT, no contractions on tocometer  Consults: None  Significant Findings/ Diagnostic Studies: none  Procedures: none  Discharge Condition: good  Disposition: 01-Home or Self Care  Diet: Regular diet  Discharge Activity: Activity as tolerated  Discharge Instructions    Discharge activity:  No Restrictions    Complete by:  As directed    Discharge diet:  No restrictions    Complete by:  As directed    Fetal Kick Count:  Lie on our left side for one hour after a meal, and count the number of times your baby kicks.  If it is less than 5 times, get up, move around and drink some juice.  Repeat the test 30 minutes later.  If it is still less than 5 kicks in an hour, notify your doctor.    Complete by:  As directed    No sexual activity restrictions    Complete by:  As directed    Notify physician for a general feeling that "something is not right"    Complete by:  As directed    Notify physician for increase or change in vaginal discharge    Complete by:  As directed    Notify physician for intestinal cramps, with or without diarrhea, sometimes described as "gas pain"    Complete by:  As directed    Notify physician for leaking of fluid    Complete by:  As directed    Notify physician for low, dull backache, unrelieved by heat or Tylenol    Complete by:  As directed    Notify physician for menstrual like cramps    Complete by:  As directed    Notify physician for pelvic pressure    Complete by:  As directed    Notify physician for uterine  contractions.  These may be painless and feel like the uterus is tightening or the baby is  "balling up"    Complete by:  As directed    Notify physician for vaginal bleeding    Complete by:  As directed    PRETERM LABOR:  Includes any of the follwing symptoms that occur between 20 - [redacted] weeks gestation.  If these symptoms are not stopped, preterm labor can result in preterm delivery, placing your baby at risk    Complete by:  As directed      Allergies as of 12/14/2016      Reactions   Tape Other (See Comments)   erythema      Medication List    TAKE these medications   albuterol 108 (90 Base) MCG/ACT inhaler Commonly known as:  PROVENTIL HFA;VENTOLIN HFA Inhale 1-2 puffs into the lungs every 6 (six) hours as needed for wheezing or shortness of breath.   budesonide 32 MCG/ACT nasal spray Commonly known as:  RHINOCORT ALLERGY Place 2 sprays into both nostrils daily.   loratadine 10 MG tablet Commonly known as:  CLARITIN Take 10 mg by mouth daily.   ondansetron 8 MG disintegrating tablet Commonly known as:  ZOFRAN ODT Take 1 tablet (8 mg total) by mouth 2 (two) times daily.  prenatal multivitamin Tabs tablet Take 1 tablet by mouth daily at 12 noon.        Total time spent taking care of this patient: 15 minutes  Signed: Dorthula Nettles 12/14/2016, 9:46 AM

## 2016-12-14 NOTE — OB Triage Note (Signed)
G2P1 presents at20w5d c/o diarrhea and ctx since 0500 this morning. No LOF or bleeding. +FM.

## 2016-12-14 NOTE — Discharge Summary (Signed)
See final progress note. 

## 2016-12-14 NOTE — Discharge Instructions (Signed)
Drink plenty of fluid, get plenty of rest. Return to the hospital if your symptoms persist or worsen.

## 2016-12-14 NOTE — Discharge Summary (Signed)
Pt d/c'd to home in stable condition. Refused immodium. Given PTL precautions and instruction to rest and drink plenty of fluids. Verbalized understading.

## 2017-01-08 ENCOUNTER — Other Ambulatory Visit: Payer: Self-pay | Admitting: Obstetrics and Gynecology

## 2017-01-08 DIAGNOSIS — Z362 Encounter for other antenatal screening follow-up: Secondary | ICD-10-CM

## 2017-01-17 ENCOUNTER — Ambulatory Visit
Admission: RE | Admit: 2017-01-17 | Discharge: 2017-01-17 | Disposition: A | Discharge status: Home / Self Care | Payer: BLUE CROSS/BLUE SHIELD | Source: Ambulatory Visit | Attending: Obstetrics and Gynecology | Admitting: Obstetrics and Gynecology

## 2017-01-17 DIAGNOSIS — Z362 Encounter for other antenatal screening follow-up: Secondary | ICD-10-CM | POA: Insufficient documentation

## 2017-01-17 DIAGNOSIS — Z3A25 25 weeks gestation of pregnancy: Secondary | ICD-10-CM | POA: Insufficient documentation

## 2017-02-07 ENCOUNTER — Ambulatory Visit (INDEPENDENT_AMBULATORY_CARE_PROVIDER_SITE_OTHER): Payer: BLUE CROSS/BLUE SHIELD | Admitting: Obstetrics & Gynecology

## 2017-02-07 ENCOUNTER — Telehealth: Payer: Self-pay

## 2017-02-07 VITALS — BP 100/60 | HR 97 | Wt 218.0 lb

## 2017-02-07 DIAGNOSIS — N898 Other specified noninflammatory disorders of vagina: Secondary | ICD-10-CM | POA: Diagnosis not present

## 2017-02-07 DIAGNOSIS — O26893 Other specified pregnancy related conditions, third trimester: Secondary | ICD-10-CM

## 2017-02-07 NOTE — Telephone Encounter (Signed)
Pt states she has just entered her 3rd trimester and is experiencing extra d/c. She has read that it is normal, but just calling to be certain as she is soaking thru undergarment & clothing.

## 2017-02-07 NOTE — Telephone Encounter (Signed)
Pt states she is having a milky white d/c that seems to be increasing. Pt admits to recent intercourse, but states that is not the d/c. Pt requests apt to verify not leaking fluid. Apt scheduled w/[facility] for 4:30 today.

## 2017-02-07 NOTE — Progress Notes (Signed)
  HPI:      [name] is a 26 y.o. G2P1001 who LMP was Patient's last menstrual period was 07/24/2016., presents today for a problem visit.  She complains of:  Vaginitis: Patient complains of an abnormal vaginal discharge for a few days. Vaginal symptoms include discharge described as white.Vulvar symptoms include none.STI Risk: Very low risk of STD exposureDischarge described as: copious and white.Other associated symptoms: none.Menstrual pattern: She had been bleeding PREG 28 weeks; PRIOR h/o PTL 30 weeks successfully treated. Contraception: none  PMHx: She  has a past medical history of Birth control; Migraines; Pregnant; and Smoker. 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 Her smoking use included Cigarettes. She has a 8.00 pack-year smoking history. She has never used smokeless tobacco. She reports that she drinks alcohol. She reports that she does not use drugs.  She has a current medication list which includes the following prescription(s): albuterol, budesonide, loratadine, ondansetron, and prenatal multivitamin. Also, is allergic to tape.  Review of Systems  Constitutional: Negative for chills, fever and malaise/fatigue.  HENT: Negative for congestion, sinus pain and sore throat.   Eyes: Negative for blurred vision and pain.  Respiratory: Negative for cough and wheezing.   Cardiovascular: Negative for chest pain and leg swelling.  Gastrointestinal: Negative for abdominal pain, constipation, diarrhea, heartburn, nausea and vomiting.  Genitourinary: Negative for dysuria, frequency, hematuria and urgency.  Musculoskeletal: Negative for back pain, joint pain, myalgias and neck pain.  Skin: Negative for itching and rash.  Neurological: Negative for dizziness, tremors and  weakness.  Endo/Heme/Allergies: Does not bruise/bleed easily.  Psychiatric/Behavioral: Negative for depression. The patient is not nervous/anxious and does not have insomnia.     Objective: BP 100/60   Pulse 97   Wt 218 lb (98.9 kg)   LMP 07/24/2016   BMI 32.19 kg/m  Physical Exam  Constitutional: She is oriented to person, place, and time. She appears well-developed and well-nourished. No distress.  Genitourinary: Vagina normal. Pelvic exam was performed with patient supine. There is no rash, tenderness or lesion on the right labia. There is no rash, tenderness or lesion on the left labia. No erythema or bleeding in the vagina.  Abdominal: Soft. She exhibits no distension. There is no tenderness.  Musculoskeletal: Normal range of motion.  Neurological: She is alert and oriented to person, place, and time. No cranial nerve deficit.  Skin: Skin is warm and dry.  Psychiatric: She has a normal mood and affect.  CERVIX SOFT BUT 0 cm/0%/ballotable  ASSESSMENT/PLAN:    Problem List Items Addressed This Visit    None    Visit Diagnoses    Vaginal discharge during pregnancy in third trimester    -  Primary   Relevant Orders   Fetal fibronectin   NuSwab Vaginitis Plus (VG+)    Barnett Applebaum, MD, [facility], [facility] 02/07/2017  5:19 PM

## 2017-02-09 LAB — FETAL FIBRONECTIN: FETAL FIBRONECTIN: POSITIVE — AB

## 2017-02-12 ENCOUNTER — Telehealth: Payer: Self-pay

## 2017-02-12 LAB — NUSWAB VAGINITIS PLUS (VG+)
Candida albicans, NAA: NEGATIVE
Candida glabrata, NAA: NEGATIVE
Chlamydia trachomatis, NAA: NEGATIVE
Neisseria gonorrhoeae, NAA: NEGATIVE
Trich vag by NAA: NEGATIVE

## 2017-02-12 NOTE — Progress Notes (Signed)
LM 3/19, 0710

## 2017-02-12 NOTE — Telephone Encounter (Signed)
Discussed results of POS fFN Pt reports improved sx's from last week and no sx's of PTL. Pt has appt Wed. Advised to come to L&D with any sx's of PTL. Otherwise will re-assess this Wed.

## 2017-02-12 NOTE — Telephone Encounter (Signed)
Pt returning call to [facility] regarding results. cb# [phone] thank you

## 2017-02-14 ENCOUNTER — Ambulatory Visit: Payer: BLUE CROSS/BLUE SHIELD | Admitting: Advanced Practice Midwife

## 2017-02-14 ENCOUNTER — Ambulatory Visit (INDEPENDENT_AMBULATORY_CARE_PROVIDER_SITE_OTHER): Payer: BLUE CROSS/BLUE SHIELD | Admitting: Advanced Practice Midwife

## 2017-02-14 ENCOUNTER — Other Ambulatory Visit: Payer: BLUE CROSS/BLUE SHIELD

## 2017-02-14 VITALS — BP 118/56 | Wt 222.0 lb

## 2017-02-14 DIAGNOSIS — Z3A29 29 weeks gestation of pregnancy: Secondary | ICD-10-CM

## 2017-02-14 DIAGNOSIS — Z131 Encounter for screening for diabetes mellitus: Secondary | ICD-10-CM

## 2017-02-14 DIAGNOSIS — Z113 Encounter for screening for infections with a predominantly sexual mode of transmission: Secondary | ICD-10-CM

## 2017-02-14 NOTE — Progress Notes (Signed)
Sharpe pains in pelvic bone/hip pain/28 week labs today

## 2017-02-14 NOTE — Progress Notes (Signed)
C/O pain in pubic bone. Recommend hip support belt and other comfort measures. 28 week labs today.

## 2017-02-14 NOTE — Patient Instructions (Signed)
Third Trimester of Pregnancy The third trimester is from week 28 through week 40 (months 7 through 9). The third trimester is a time when the unborn baby (fetus) is growing rapidly. At the end of the ninth month, the fetus is about 20 inches in length and weighs 6-10 pounds. Body changes during your third trimester Your body will continue to go through many changes during pregnancy. The changes vary from woman to woman. During the third trimester:  Your weight will continue to increase. You can expect to gain 25-35 pounds (11-16 kg) by the end of the pregnancy.  You may begin to get stretch marks on your hips, abdomen, and breasts.  You may urinate more often because the fetus is moving lower into your pelvis and pressing on your bladder.  You may develop or continue to have heartburn. This is caused by increased hormones that slow down muscles in the digestive tract.  You may develop or continue to have constipation because increased hormones slow digestion and cause the muscles that push waste through your intestines to relax.  You may develop hemorrhoids. These are swollen veins (varicose veins) in the rectum that can itch or be painful.  You may develop swollen, bulging veins (varicose veins) in your legs.  You may have increased body aches in the pelvis, back, or thighs. This is due to weight gain and increased hormones that are relaxing your joints.  You may have changes in your hair. These can include thickening of your hair, rapid growth, and changes in texture. Some women also have hair loss during or after pregnancy, or hair that feels dry or thin. Your hair will most likely return to normal after your baby is born.  Your breasts will continue to grow and they will continue to become tender. A yellow fluid (colostrum) may leak from your breasts. This is the first milk you are producing for your baby.  Your belly button may stick out.  You may notice more swelling in your hands,  face, or ankles.  You may have increased tingling or numbness in your hands, arms, and legs. The skin on your belly may also feel numb.  You may feel short of breath because of your expanding uterus.  You may have more problems sleeping. This can be caused by the size of your belly, increased need to urinate, and an increase in your body's metabolism.  You may notice the fetus "dropping," or moving lower in your abdomen (lightening).  You may have increased vaginal discharge.  You may notice your joints feel loose and you may have pain around your pelvic bone.  What to expect at prenatal visits You will have prenatal exams every 2 weeks until week 36. Then you will have weekly prenatal exams. During a routine prenatal visit:  You will be weighed to make sure you and the baby are growing normally.  Your blood pressure will be taken.  Your abdomen will be measured to track your baby's growth.  The fetal heartbeat will be listened to.  Any test results from the previous visit will be discussed.  You may have a cervical check near your due date to see if your cervix has softened or thinned (effaced).  You will be tested for Group B streptococcus. This happens between 35 and 37 weeks.  Your health care provider may ask you:  What your birth plan is.  How you are feeling.  If you are feeling the baby move.  If you have had   any abnormal symptoms, such as leaking fluid, bleeding, severe headaches, or abdominal cramping.  If you are using any tobacco products, including cigarettes, chewing tobacco, and electronic cigarettes.  If you have any questions.  Other tests or screenings that may be performed during your third trimester include:  Blood tests that check for low iron levels (anemia).  Fetal testing to check the health, activity level, and growth of the fetus. Testing is done if you have certain medical conditions or if there are problems during the  pregnancy.  Nonstress test (NST). This test checks the health of your baby to make sure there are no signs of problems, such as the baby not getting enough oxygen. During this test, a belt is placed around your belly. The baby is made to move, and its heart rate is monitored during movement.  What is false labor? False labor is a condition in which you feel small, irregular tightenings of the muscles in the womb (contractions) that usually go away with rest, changing position, or drinking water. These are called Braxton Hicks contractions. Contractions may last for hours, days, or even weeks before true labor sets in. If contractions come at regular intervals, become more frequent, increase in intensity, or become painful, you should see your health care provider. What are the signs of labor?  Abdominal cramps.  Regular contractions that start at 10 minutes apart and become stronger and more frequent with time.  Contractions that start on the top of the uterus and spread down to the lower abdomen and back.  Increased pelvic pressure and dull back pain.  A watery or bloody mucus discharge that comes from the vagina.  Leaking of amniotic fluid. This is also known as your "water breaking." It could be a slow trickle or a gush. Let your health care provider know if it has a color or strange odor. If you have any of these signs, call your health care provider right away, even if it is before your due date. Follow these instructions at home: Medicines  Follow your health care provider's instructions regarding medicine use. Specific medicines may be either safe or unsafe to take during pregnancy.  Take a prenatal vitamin that contains at least 600 micrograms (mcg) of folic acid.  If you develop constipation, try taking a stool softener if your health care provider approves. Eating and drinking  Eat a balanced diet that includes fresh fruits and vegetables, whole grains, good sources of protein  such as meat, eggs, or tofu, and low-fat dairy. Your health care provider will help you determine the amount of weight gain that is right for you.  Avoid raw meat and uncooked cheese. These carry germs that can cause birth defects in the baby.  If you have low calcium intake from food, talk to your health care provider about whether you should take a daily calcium supplement.  Eat four or five small meals rather than three large meals a day.  Limit foods that are high in fat and processed sugars, such as fried and sweet foods.  To prevent constipation: ? Drink enough fluid to keep your urine clear or pale yellow. ? Eat foods that are high in fiber, such as fresh fruits and vegetables, whole grains, and beans. Activity  Exercise only as directed by your health care provider. Most women can continue their usual exercise routine during pregnancy. Try to exercise for 30 minutes at least 5 days a week. Stop exercising if you experience uterine contractions.  Avoid heavy   lifting.  Do not exercise in extreme heat or humidity, or at high altitudes.  Wear low-heel, comfortable shoes.  Practice good posture.  You may continue to have sex unless your health care provider tells you otherwise. Relieving pain and discomfort  Take frequent breaks and rest with your legs elevated if you have leg cramps or low back pain.  Take warm sitz baths to soothe any pain or discomfort caused by hemorrhoids. Use hemorrhoid cream if your health care provider approves.  Wear a good support bra to prevent discomfort from breast tenderness.  If you develop varicose veins: ? Wear support pantyhose or compression stockings as told by your healthcare provider. ? Elevate your feet for 15 minutes, 3-4 times a day. Prenatal care  Write down your questions. Take them to your prenatal visits.  Keep all your prenatal visits as told by your health care provider. This is important. Safety  Wear your seat belt at  all times when driving.  Make a list of emergency phone numbers, including numbers for family, friends, the hospital, and police and fire departments. General instructions  Avoid cat litter boxes and soil used by cats. These carry germs that can cause birth defects in the baby. If you have a cat, ask someone to clean the litter box for you.  Do not travel far distances unless it is absolutely necessary and only with the approval of your health care provider.  Do not use hot tubs, steam rooms, or saunas.  Do not drink alcohol.  Do not use any products that contain nicotine or tobacco, such as cigarettes and e-cigarettes. If you need help quitting, ask your health care provider.  Do not use any medicinal herbs or unprescribed drugs. These chemicals affect the formation and growth of the baby.  Do not douche or use tampons or scented sanitary pads.  Do not cross your legs for long periods of time.  To prepare for the arrival of your baby: ? Take prenatal classes to understand, practice, and ask questions about labor and delivery. ? Make a trial run to the hospital. ? Visit the hospital and tour the maternity area. ? Arrange for maternity or paternity leave through employers. ? Arrange for family and friends to take care of pets while you are in the hospital. ? Purchase a rear-facing car seat and make sure you know how to install it in your car. ? Pack your hospital bag. ? Prepare the baby's nursery. Make sure to remove all pillows and stuffed animals from the baby's crib to prevent suffocation.  Visit your dentist if you have not gone during your pregnancy. Use a soft toothbrush to brush your teeth and be gentle when you floss. Contact a health care provider if:  You are unsure if you are in labor or if your water has broken.  You become dizzy.  You have mild pelvic cramps, pelvic pressure, or nagging pain in your abdominal area.  You have lower back pain.  You have persistent  nausea, vomiting, or diarrhea.  You have an unusual or bad smelling vaginal discharge.  You have pain when you urinate. Get help right away if:  Your water breaks before 37 weeks.  You have regular contractions less than 5 minutes apart before 37 weeks.  You have a fever.  You are leaking fluid from your vagina.  You have spotting or bleeding from your vagina.  You have severe abdominal pain or cramping.  You have rapid weight loss or weight gain.    You have shortness of breath with chest pain.  You notice sudden or extreme swelling of your face, hands, ankles, feet, or legs.  Your baby makes fewer than 10 movements in 2 hours.  You have severe headaches that do not go away when you take medicine.  You have vision changes. Summary  The third trimester is from week 28 through week 40, months 7 through 9. The third trimester is a time when the unborn baby (fetus) is growing rapidly.  During the third trimester, your discomfort may increase as you and your baby continue to gain weight. You may have abdominal, leg, and back pain, sleeping problems, and an increased need to urinate.  During the third trimester your breasts will keep growing and they will continue to become tender. A yellow fluid (colostrum) may leak from your breasts. This is the first milk you are producing for your baby.  False labor is a condition in which you feel small, irregular tightenings of the muscles in the womb (contractions) that eventually go away. These are called Braxton Hicks contractions. Contractions may last for hours, days, or even weeks before true labor sets in.  Signs of labor can include: abdominal cramps; regular contractions that start at 10 minutes apart and become stronger and more frequent with time; watery or bloody mucus discharge that comes from the vagina; increased pelvic pressure and dull back pain; and leaking of amniotic fluid. This information is not intended to replace advice  given to you by your health care provider. Make sure you discuss any questions you have with your health care provider. Document Released: 11/07/2001 Document Revised: 04/20/2016 Document Reviewed: 01/14/2013 Elsevier Interactive Patient Education  2017 [employer]  

## 2017-02-15 LAB — 28 WEEK RH+PANEL
BASOS ABS: 0 10*3/uL (ref 0.0–0.2)
Basos: 0 %
EOS (ABSOLUTE): 0.5 10*3/uL — ABNORMAL HIGH (ref 0.0–0.4)
EOS: 3 %
Gestational Diabetes Screen: 110 mg/dL (ref 65–139)
HIV SCREEN 4TH GENERATION: NONREACTIVE
Hematocrit: 36.5 % (ref 34.0–46.6)
Hemoglobin: 12.4 g/dL (ref 11.1–15.9)
IMMATURE GRANS (ABS): 0.1 10*3/uL (ref 0.0–0.1)
IMMATURE GRANULOCYTES: 1 %
LYMPHS ABS: 3.4 10*3/uL — AB (ref 0.7–3.1)
LYMPHS: 21 %
MCH: 28.6 pg (ref 26.6–33.0)
MCHC: 34 g/dL (ref 31.5–35.7)
MCV: 84 fL (ref 79–97)
MONOCYTES: 8 %
Monocytes Absolute: 1.3 10*3/uL — ABNORMAL HIGH (ref 0.1–0.9)
NEUTROS PCT: 67 %
Neutrophils Absolute: 11.2 10*3/uL — ABNORMAL HIGH (ref 1.4–7.0)
PLATELETS: 399 10*3/uL — AB (ref 150–379)
RBC: 4.33 x10E6/uL (ref 3.77–5.28)
RDW: 14 % (ref 12.3–15.4)
RPR Ser Ql: NONREACTIVE
WBC: 16.5 10*3/uL — AB (ref 3.4–10.8)

## 2017-02-16 ENCOUNTER — Telehealth: Payer: Self-pay

## 2017-02-16 NOTE — Telephone Encounter (Signed)
FMLA/DISABILITY form for [employer] filled out and given to [place] for processing.

## 2017-03-01 ENCOUNTER — Encounter: Payer: Self-pay | Admitting: Certified Nurse Midwife

## 2017-03-02 ENCOUNTER — Ambulatory Visit (INDEPENDENT_AMBULATORY_CARE_PROVIDER_SITE_OTHER): Payer: BLUE CROSS/BLUE SHIELD | Admitting: Certified Nurse Midwife

## 2017-03-02 VITALS — BP 102/62 | Wt 222.0 lb

## 2017-03-02 DIAGNOSIS — Z3490 Encounter for supervision of normal pregnancy, unspecified, unspecified trimester: Secondary | ICD-10-CM

## 2017-03-02 DIAGNOSIS — Z3403 Encounter for supervision of normal first pregnancy, third trimester: Secondary | ICD-10-CM

## 2017-03-02 NOTE — Progress Notes (Signed)
Pt reports no problems. blood consent today. tdap NEXT VISIT due to out today.Good Fm.  Breast feeding. 28 week labs WNL.

## 2017-03-13 ENCOUNTER — Telehealth: Payer: Self-pay

## 2017-03-13 NOTE — Telephone Encounter (Signed)
Pt states she has been having a pinching/burning sensation in right ovary for the past 3 days and today baby feels lower than normal. LMTRC.

## 2017-03-13 NOTE — Telephone Encounter (Signed)
Pt called again this afternoon stating that she has had contractions every 20-30  mins for the past 2 hours but they are not painful. She also c/o cramping and diarrhea. Pt has tried drinking water and lying down to see if they go away but they have not. Pt is concerned due to history of pre-term labor at 30 weeks with G1. Advised pt to go to L&D for monitoring if she is experiencing the same symptoms as before and to continue to monitor contractions.

## 2017-03-13 NOTE — Telephone Encounter (Signed)
Advised pt to discuss with JEG at her appt on Friday. If pain becomes overwhelming to go to ER. Pt denies history of ovarian cysts. Advised may be round ligament pain and may try stretching, warm rick pack and belly support band. Pt reports good fetal movement and no blood or LOF.

## 2017-03-16 ENCOUNTER — Ambulatory Visit (INDEPENDENT_AMBULATORY_CARE_PROVIDER_SITE_OTHER): Payer: BLUE CROSS/BLUE SHIELD | Admitting: Advanced Practice Midwife

## 2017-03-16 VITALS — BP 110/66 | Wt 228.0 lb

## 2017-03-16 DIAGNOSIS — Z23 Encounter for immunization: Secondary | ICD-10-CM

## 2017-03-16 DIAGNOSIS — Z3A33 33 weeks gestation of pregnancy: Secondary | ICD-10-CM

## 2017-03-16 NOTE — Progress Notes (Signed)
Watery discharge/hip pressure/ TDAP today

## 2017-03-16 NOTE — Progress Notes (Signed)
Hip pain/clicking- discussed exercise, arnica topically. Had q 30 min contractions recently. Admits to inadequate hydration- recommended increase intake of h2o. Has discharge every time she uses the bathroom x 3 weeks- watery with yellow tinge. Discussed possibility of urine leaking. Reviewed labor precautions.

## 2017-03-27 ENCOUNTER — Observation Stay
Admission: EM | Admit: 2017-03-27 | Discharge: 2017-03-27 | Disposition: A | Discharge status: Home / Self Care | Payer: BLUE CROSS/BLUE SHIELD | Attending: Obstetrics and Gynecology | Admitting: Obstetrics and Gynecology

## 2017-03-27 DIAGNOSIS — Z3A35 35 weeks gestation of pregnancy: Secondary | ICD-10-CM | POA: Insufficient documentation

## 2017-03-27 DIAGNOSIS — O26893 Other specified pregnancy related conditions, third trimester: Principal | ICD-10-CM | POA: Insufficient documentation

## 2017-03-27 DIAGNOSIS — A084 Viral intestinal infection, unspecified: Secondary | ICD-10-CM | POA: Insufficient documentation

## 2017-03-27 DIAGNOSIS — R112 Nausea with vomiting, unspecified: Secondary | ICD-10-CM | POA: Diagnosis not present

## 2017-03-27 LAB — LIPASE, BLOOD: LIPASE: 16 U/L (ref 11–51)

## 2017-03-27 LAB — COMPREHENSIVE METABOLIC PANEL
ALBUMIN: 3.2 g/dL — AB (ref 3.5–5.0)
ALT: 13 U/L — ABNORMAL LOW (ref 14–54)
AST: 22 U/L (ref 15–41)
Alkaline Phosphatase: 97 U/L (ref 38–126)
Anion gap: 10 (ref 5–15)
BUN: 8 mg/dL (ref 6–20)
CHLORIDE: 105 mmol/L (ref 101–111)
CO2: 20 mmol/L — ABNORMAL LOW (ref 22–32)
Calcium: 8.6 mg/dL — ABNORMAL LOW (ref 8.9–10.3)
Creatinine, Ser: 0.52 mg/dL (ref 0.44–1.00)
GFR calc Af Amer: >60 mL/min (ref >60)
GLUCOSE: 111 mg/dL — AB (ref 65–99)
POTASSIUM: 3.1 mmol/L — AB (ref 3.5–5.1)
Sodium: 135 mmol/L (ref 135–145)
Total Bilirubin: 1 mg/dL (ref 0.3–1.2)
Total Protein: 6.8 g/dL (ref 6.5–8.1)

## 2017-03-27 MED ORDER — ONDANSETRON 4 MG PO TBDP
8.0000 mg | ORAL_TABLET | Freq: Once | ORAL | Status: AC
  Administered 2017-03-27: 8 mg via ORAL
  Filled 2017-03-27: qty 2

## 2017-03-27 MED ORDER — CALCIUM CARBONATE ANTACID 500 MG PO CHEW
CHEWABLE_TABLET | ORAL | Status: AC
  Administered 2017-03-27: 400 mg via ORAL
  Filled 2017-03-27: qty 2

## 2017-03-27 MED ORDER — ONDANSETRON 4 MG PO TBDP: 4.0000 mg | ORAL_TABLET | Freq: Four times a day (QID) | ORAL | 0 refills | Status: DC | PRN

## 2017-03-27 MED ORDER — CALCIUM CARBONATE ANTACID 500 MG PO CHEW
2.0000 | CHEWABLE_TABLET | Freq: Once | ORAL | Status: AC
  Administered 2017-03-27: 400 mg via ORAL

## 2017-03-27 NOTE — Final Progress Note (Signed)
Physician Final Progress Note  Patient ID: Erika Barnes MRN: [id number] DOB/AGE: Mar 18, 1991 26 y.o.  Admit date: 03/27/2017 Admitting provider: Malachy Mood, MD Discharge date: 03/27/2017   Admission Diagnoses: Nausea vomitting  Discharge Diagnoses:  Active Problems:   Labor and delivery indication for care or intervention Viral gastroenteritis  26 year old G2P1001 at [redacted]w[redacted]d presenting with nausea, vomiting starting this morning.  Improved since 1600 and has shown some po tolerance.  Treated with zofran ODT,able to keep down liquids and cracker with no emesis.  Labs normal lipase, CMP normal other than slight dip in potassium.  +FM, no LOF, no VB   Consults: None  Significant Findings/ Diagnostic Studies:  Results for orders placed or performed during the hospital encounter of 03/27/17 (from the past 24 hour(s))  Lipase, blood     Status: None   Collection Time: 03/27/17  9:58 PM  Result Value Ref Range   Lipase 16 11 - 51 U/L  Comprehensive metabolic panel     Status: Abnormal   Collection Time: 03/27/17  9:58 PM  Result Value Ref Range   Sodium 135 135 - 145 mmol/L   Potassium 3.1 (L) 3.5 - 5.1 mmol/L   Chloride 105 101 - 111 mmol/L   CO2 20 (L) 22 - 32 mmol/L   Glucose, Bld 111 (H) 65 - 99 mg/dL   BUN 8 6 - 20 mg/dL   Creatinine, Ser 0.52 0.44 - 1.00 mg/dL   Calcium 8.6 (L) 8.9 - 10.3 mg/dL   Total Protein 6.8 6.5 - 8.1 g/dL   Albumin 3.2 (L) 3.5 - 5.0 g/dL   AST 22 15 - 41 U/L   ALT 13 (L) 14 - 54 U/L   Alkaline Phosphatase 97 38 - 126 U/L   Total Bilirubin 1.0 0.3 - 1.2 mg/dL   GFR calc non Af Amer >60 >60 mL/min   GFR calc Af Amer >60 >60 mL/min   Anion gap 10 5 - 15   Procedures: NST Baseline: 150 Variability: moderate Accelerations: present Decelerations: absent Tocometry: none The patient was monitored for 30 minutes, fetal heart rate tracing was deemed reactive, category I tracing,   Discharge Condition: good  Disposition: 01-Home or Self  Care  Diet: Regular diet  Discharge Activity: Activity as tolerated  Discharge Instructions    Discharge activity:  No Restrictions    Complete by:  As directed    Discharge diet:  No restrictions    Complete by:  As directed    Fetal Kick Count:  Lie on our left side for one hour after a meal, and count the number of times your baby kicks.  If it is less than 5 times, get up, move around and drink some juice.  Repeat the test 30 minutes later.  If it is still less than 5 kicks in an hour, notify your doctor.    Complete by:  As directed    LABOR:  When conractions begin, you should start to time them from the beginning of one contraction to the beginning  of the next.  When contractions are 5 - 10 minutes apart or less and have been regular for at least an hour, you should call your health care provider.    Complete by:  As directed    No sexual activity restrictions    Complete by:  As directed    Notify physician for bleeding from the vagina    Complete by:  As directed    Notify physician for blurring  of vision or spots before the eyes    Complete by:  As directed    Notify physician for chills or fever    Complete by:  As directed    Notify physician for fainting spells, "black outs" or loss of consciousness    Complete by:  As directed    Notify physician for increase in vaginal discharge    Complete by:  As directed    Notify physician for leaking of fluid    Complete by:  As directed    Notify physician for pain or burning when urinating    Complete by:  As directed    Notify physician for pelvic pressure (sudden increase)    Complete by:  As directed    Notify physician for severe or continued nausea or vomiting    Complete by:  As directed    Notify physician for sudden gushing of fluid from the vagina (with or without continued leaking)    Complete by:  As directed    Notify physician for sudden, constant, or occasional abdominal pain    Complete by:  As directed     Notify physician if baby moving less than usual    Complete by:  As directed      Allergies as of 03/27/2017      Reactions   Tape Other (See Comments)   erythema      Medication List    TAKE these medications   albuterol 108 (90 Base) MCG/ACT inhaler Commonly known as:  PROVENTIL HFA;VENTOLIN HFA Inhale 1-2 puffs into the lungs every 6 (six) hours as needed for wheezing or shortness of breath.   loratadine 10 MG tablet Commonly known as:  CLARITIN Take 10 mg by mouth daily.   ondansetron 4 MG disintegrating tablet Commonly known as:  ZOFRAN ODT Take 1 tablet (4 mg total) by mouth every 6 (six) hours as needed for nausea.   prenatal multivitamin Tabs tablet Take 1 tablet by mouth daily at 12 noon.      Follow-up Information    [facility] Follow up on 03/30/2017.   Why:  please keep scheduled OB appointment Contact information: [address] [place] [address] [phone]          Total time spent taking care of this patient: 30 minutes  Signed: Malachy Mood 03/27/2017, 11:28 PM

## 2017-03-27 NOTE — Discharge Summary (Signed)
Patient discharged with instructions on follow up appointment, new oral prescription for Zofran, labor precautions, and when to seek medical attention. Patient ambulatory at discharged with steady gait and no complaints. Patient discharged with husband. Patient verbalized understanding of discharge instructions.

## 2017-03-27 NOTE — Discharge Summary (Signed)
See final progress note. 

## 2017-03-27 NOTE — OB Triage Note (Signed)
Patient came in for observation for vomiting and diarrhea since this morning. Patient has only been able to keep only PO fluids down. Patient reports no uterine contractions at this time. Patient denies leaking of fluid but denies vaginal bleeding and spotting. Patient states she has been feeling the baby move fine. Vital signs stable and patient afebrile. FHR baseline 145 with moderate variability with accelerations 15 x 15 and no decelerations. Husband at bedside. Will continue to monitor.

## 2017-03-30 ENCOUNTER — Encounter: Payer: BLUE CROSS/BLUE SHIELD | Admitting: Advanced Practice Midwife

## 2017-03-30 ENCOUNTER — Encounter: Payer: BLUE CROSS/BLUE SHIELD | Admitting: Certified Nurse Midwife

## 2017-04-04 ENCOUNTER — Encounter: Payer: Self-pay | Admitting: Emergency Medicine

## 2017-04-04 ENCOUNTER — Telehealth: Payer: Self-pay

## 2017-04-04 ENCOUNTER — Emergency Department: Payer: BLUE CROSS/BLUE SHIELD

## 2017-04-04 ENCOUNTER — Emergency Department
Admission: EM | Admit: 2017-04-04 | Discharge: 2017-04-04 | Disposition: A | Discharge status: Home / Self Care | Payer: BLUE CROSS/BLUE SHIELD | Attending: Student in an Organized Health Care Education/Training Program | Admitting: Student in an Organized Health Care Education/Training Program

## 2017-04-04 DIAGNOSIS — R600 Localized edema: Secondary | ICD-10-CM | POA: Diagnosis not present

## 2017-04-04 DIAGNOSIS — M7989 Other specified soft tissue disorders: Secondary | ICD-10-CM

## 2017-04-04 DIAGNOSIS — Z3A37 37 weeks gestation of pregnancy: Secondary | ICD-10-CM | POA: Insufficient documentation

## 2017-04-04 DIAGNOSIS — Z87891 Personal history of nicotine dependence: Secondary | ICD-10-CM | POA: Insufficient documentation

## 2017-04-04 DIAGNOSIS — O26893 Other specified pregnancy related conditions, third trimester: Secondary | ICD-10-CM | POA: Insufficient documentation

## 2017-04-04 DIAGNOSIS — Z79899 Other long term (current) drug therapy: Secondary | ICD-10-CM | POA: Insufficient documentation

## 2017-04-04 DIAGNOSIS — M79672 Pain in left foot: Secondary | ICD-10-CM

## 2017-04-04 DIAGNOSIS — J45909 Unspecified asthma, uncomplicated: Secondary | ICD-10-CM | POA: Insufficient documentation

## 2017-04-04 DIAGNOSIS — M79662 Pain in left lower leg: Secondary | ICD-10-CM

## 2017-04-04 NOTE — ED Triage Notes (Signed)
Pt to ed with c/o swelling to left foot and lower leg, no bruising noted.  Pt denies injury, denies pain in left leg or left knee. Pt states sent here by OBGYN for eval.  Pt is approx [redacted] weeks pregnant.
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
## 2017-04-04 NOTE — Telephone Encounter (Signed)
Pt states that her left foot and lower leg is twice the size of her right foot and has hx of DVT. Per AMS pt to go to ER for evaluation. Pt aware.

## 2017-04-05 ENCOUNTER — Ambulatory Visit (INDEPENDENT_AMBULATORY_CARE_PROVIDER_SITE_OTHER): Payer: BLUE CROSS/BLUE SHIELD | Admitting: Certified Nurse Midwife

## 2017-04-05 VITALS — BP 108/68 | Wt 230.0 lb

## 2017-04-05 DIAGNOSIS — O0993 Supervision of high risk pregnancy, unspecified, third trimester: Secondary | ICD-10-CM

## 2017-04-05 DIAGNOSIS — Z3A36 36 weeks gestation of pregnancy: Secondary | ICD-10-CM

## 2017-04-05 DIAGNOSIS — Z3685 Encounter for antenatal screening for Streptococcus B: Secondary | ICD-10-CM

## 2017-04-05 DIAGNOSIS — Z113 Encounter for screening for infections with a predominantly sexual mode of transmission: Secondary | ICD-10-CM

## 2017-04-05 NOTE — Progress Notes (Signed)
Seen in ER for edema of lower extremities L>R. No evidence of DVT on Doppler study. No calf tenderness. Baby active. Mucoid discharge with irregular contractions Breast/ Paragard IUD Labor precautions GBS/ Aptima today. ROB in 1 week

## 2017-04-05 NOTE — Progress Notes (Signed)
Pt went to ER yesterday due to increased swelling in left foot and hx of DVT. No problems from visit. GBS today.

## 2017-04-07 LAB — CHLAMYDIA/GONOCOCCUS/TRICHOMONAS, NAA
Chlamydia by NAA: NEGATIVE
GONOCOCCUS BY NAA: NEGATIVE
TRICH VAG BY NAA: NEGATIVE

## 2017-04-07 LAB — STREP GP B NAA: STREP GROUP B AG: NEGATIVE

## 2017-04-13 ENCOUNTER — Ambulatory Visit (INDEPENDENT_AMBULATORY_CARE_PROVIDER_SITE_OTHER): Payer: BLUE CROSS/BLUE SHIELD | Admitting: Obstetrics and Gynecology

## 2017-04-13 VITALS — BP 114/70 | Wt 231.0 lb

## 2017-04-13 DIAGNOSIS — Z3A37 37 weeks gestation of pregnancy: Secondary | ICD-10-CM

## 2017-04-13 DIAGNOSIS — Z86718 Personal history of other venous thrombosis and embolism: Secondary | ICD-10-CM

## 2017-04-13 DIAGNOSIS — F17200 Nicotine dependence, unspecified, uncomplicated: Secondary | ICD-10-CM

## 2017-04-13 DIAGNOSIS — Z3490 Encounter for supervision of normal pregnancy, unspecified, unspecified trimester: Secondary | ICD-10-CM

## 2017-04-13 NOTE — Progress Notes (Signed)
Pt thinks she might have lost more of mucous plug. No vb

## 2017-04-17 ENCOUNTER — Telehealth: Payer: Self-pay

## 2017-04-17 NOTE — Telephone Encounter (Signed)
Pt c/o nausea,vomiting and diarrhea since 3 am due to possible food poisoning from eating at a salad bar yesterday. Reports good fetal movement and denies fever. Advised if this persists past 24 hours to go to L&D for monitoring and possible fluids. Also encouraged to hydrate and eat bland foods. Pt has f/u ROB on 5/24.

## 2017-04-19 ENCOUNTER — Ambulatory Visit (INDEPENDENT_AMBULATORY_CARE_PROVIDER_SITE_OTHER): Payer: BLUE CROSS/BLUE SHIELD | Admitting: Advanced Practice Midwife

## 2017-04-19 VITALS — BP 100/56 | Wt 230.0 lb

## 2017-04-19 DIAGNOSIS — Z3A38 38 weeks gestation of pregnancy: Secondary | ICD-10-CM

## 2017-04-19 NOTE — Progress Notes (Signed)
Doing well. Had GI upset on Monday of this week but feels better now. Occasional strong contractions. Reviewed labor precautions, FKCs.

## 2017-04-19 NOTE — Progress Notes (Signed)
Extra Mucous lately/menstrual cramp type ctx

## 2017-04-25 ENCOUNTER — Ambulatory Visit (INDEPENDENT_AMBULATORY_CARE_PROVIDER_SITE_OTHER): Payer: BLUE CROSS/BLUE SHIELD | Admitting: Obstetrics and Gynecology

## 2017-04-25 VITALS — BP 114/78 | Wt 230.0 lb

## 2017-04-25 DIAGNOSIS — Z3A39 39 weeks gestation of pregnancy: Secondary | ICD-10-CM

## 2017-04-25 DIAGNOSIS — Z86718 Personal history of other venous thrombosis and embolism: Secondary | ICD-10-CM

## 2017-04-25 DIAGNOSIS — Z3490 Encounter for supervision of normal pregnancy, unspecified, unspecified trimester: Secondary | ICD-10-CM

## 2017-04-25 NOTE — Progress Notes (Signed)
Pt has had contractions every 15 mins today, not strong. No vb.

## 2017-05-02 ENCOUNTER — Ambulatory Visit (INDEPENDENT_AMBULATORY_CARE_PROVIDER_SITE_OTHER): Payer: BLUE CROSS/BLUE SHIELD | Admitting: Obstetrics and Gynecology

## 2017-05-02 VITALS — BP 106/62 | Wt 232.0 lb

## 2017-05-02 DIAGNOSIS — Z3A40 40 weeks gestation of pregnancy: Secondary | ICD-10-CM

## 2017-05-02 DIAGNOSIS — Z3490 Encounter for supervision of normal pregnancy, unspecified, unspecified trimester: Secondary | ICD-10-CM

## 2017-05-02 DIAGNOSIS — O0973 Supervision of high risk pregnancy due to social problems, third trimester: Secondary | ICD-10-CM

## 2017-05-02 DIAGNOSIS — Z86718 Personal history of other venous thrombosis and embolism: Secondary | ICD-10-CM

## 2017-05-02 DIAGNOSIS — C4361 Malignant melanoma of right upper limb, including shoulder: Secondary | ICD-10-CM
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
## 2017-05-03 ENCOUNTER — Observation Stay
Admission: EM | Admit: 2017-05-03 | Discharge: 2017-05-03 | Disposition: A | Discharge status: Home / Self Care | Payer: BLUE CROSS/BLUE SHIELD | Attending: Obstetrics & Gynecology | Admitting: Obstetrics & Gynecology

## 2017-05-03 ENCOUNTER — Ambulatory Visit (INDEPENDENT_AMBULATORY_CARE_PROVIDER_SITE_OTHER): Payer: BLUE CROSS/BLUE SHIELD | Admitting: Certified Nurse Midwife

## 2017-05-03 ENCOUNTER — Encounter: Payer: Self-pay | Admitting: *Deleted

## 2017-05-03 ENCOUNTER — Telehealth: Payer: Self-pay

## 2017-05-03 VITALS — BP 102/72 | Wt 229.0 lb

## 2017-05-03 DIAGNOSIS — O471 False labor at or after 37 completed weeks of gestation: Secondary | ICD-10-CM | POA: Diagnosis not present

## 2017-05-03 DIAGNOSIS — Z3490 Encounter for supervision of normal pregnancy, unspecified, unspecified trimester: Secondary | ICD-10-CM

## 2017-05-03 DIAGNOSIS — O0993 Supervision of high risk pregnancy, unspecified, third trimester: Secondary | ICD-10-CM

## 2017-05-03 DIAGNOSIS — Z3A41 41 weeks gestation of pregnancy: Secondary | ICD-10-CM | POA: Diagnosis not present

## 2017-05-03 DIAGNOSIS — Z3A40 40 weeks gestation of pregnancy: Secondary | ICD-10-CM

## 2017-05-03 NOTE — OB Triage Note (Signed)
Pt c/o pink tinged mucous and contractions every 5 min that started about 2 hours ago. She rates them a 2 out of 10 on the pain scale. Feeling baby move ok. No intercourse in the past 24 hours. States she has not has a lot of water today. No LOF.

## 2017-05-03 NOTE — Telephone Encounter (Signed)
Pt calling - is over 40wks, membranes stripped yesterday.  She has been leaking watery fluid every since.  No ctx.  Appt made.

## 2017-05-03 NOTE — Discharge Instructions (Signed)
Come back if: ° °Big gush of fluids °Decreased fetal movement °Heavy vaginal bleeding °Temp over 100.4 °Contractions every 3-5 min lasting at least one hour ° °Get plenty of rest and stay well hydrated! °

## 2017-05-03 NOTE — Progress Notes (Signed)
Pt reports feeling like she is leaking a watery fluid on and off since having her membranes stripped yesterday.

## 2017-05-03 NOTE — Progress Notes (Signed)
Spoke with Dr. Kenton Kingfisher. May discharge pt since vaginal exam was similar in office yesterday and rare, irregular contractions. Pt given the option to walk for an hour and be rechecked or go home now.

## 2017-05-03 NOTE — Progress Notes (Signed)
Complains of intermittent watery mucous discharge since her membranes were swept yesterday. No bleeding. Irregular contractions FHTS WNL SSE: External: vulva dry/ no inflammation or lesions Vagina: mucoid discharge seen Cervix: NO fluid when coughing. Mucoid discharge at os. 3.5 cm/50%/-3 Nitrazine negative Fern negative A: no evidence of SROM P: Labor precautions IOL scheduled for 11 June if NIL

## 2017-05-05 NOTE — Discharge Summary (Signed)
Patient presented for evaluation of labor.  Patient had cervical exam by RN and this was reported to me. I reviewed her vital signs and fetal tracing, both of which were reassuring.  Patient was discharge as she was not laboring.

## 2017-05-06 ENCOUNTER — Observation Stay
Admission: EM | Admit: 2017-05-06 | Discharge: 2017-05-06 | Disposition: A | Discharge status: Home / Self Care | Payer: BLUE CROSS/BLUE SHIELD | Source: Home / Self Care | Admitting: Obstetrics and Gynecology

## 2017-05-06 ENCOUNTER — Encounter: Payer: Self-pay | Admitting: *Deleted

## 2017-05-06 DIAGNOSIS — Z87891 Personal history of nicotine dependence: Secondary | ICD-10-CM

## 2017-05-06 DIAGNOSIS — Z3A40 40 weeks gestation of pregnancy: Secondary | ICD-10-CM

## 2017-05-06 DIAGNOSIS — Z0379 Encounter for other suspected maternal and fetal conditions ruled out: Secondary | ICD-10-CM | POA: Insufficient documentation

## 2017-05-06 DIAGNOSIS — O479 False labor, unspecified: Secondary | ICD-10-CM | POA: Diagnosis not present

## 2017-05-06 NOTE — Discharge Summary (Signed)
See Final Progress Note 

## 2017-05-06 NOTE — OB Triage Note (Signed)
Patient came to [facility] for complaint of watery discharge that started this morning. No complaint of pain.   She also stated that she had not felt her baby moving as much.  No cervical change from previous check at office visit.
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
## 2017-05-06 NOTE — Discharge Instructions (Signed)
Please keep next scheduled appointment.  If you have any questions or concerns you may call the on call provider.  For urgent concerns please go to the emergency department for further evaluation.

## 2017-05-07 ENCOUNTER — Encounter: Payer: Self-pay | Admitting: *Deleted

## 2017-05-07 ENCOUNTER — Inpatient Hospital Stay
Admission: EM | Admit: 2017-05-07 | Discharge: 2017-05-09 | DRG: 775 | Disposition: A | Discharge status: Home / Self Care | Payer: BLUE CROSS/BLUE SHIELD | Attending: Obstetrics and Gynecology | Admitting: Obstetrics and Gynecology

## 2017-05-07 ENCOUNTER — Inpatient Hospital Stay: Payer: BLUE CROSS/BLUE SHIELD | Admitting: Anesthesiology

## 2017-05-07 DIAGNOSIS — Z3A41 41 weeks gestation of pregnancy: Secondary | ICD-10-CM

## 2017-05-07 DIAGNOSIS — O480 Post-term pregnancy: Secondary | ICD-10-CM | POA: Diagnosis present

## 2017-05-07 LAB — CBC
HCT: 35.8 % (ref 35.0–47.0)
Hemoglobin: 12 g/dL (ref 12.0–16.0)
MCH: 27.7 pg (ref 26.0–34.0)
MCHC: 33.7 g/dL (ref 32.0–36.0)
MCV: 82.3 fL (ref 80.0–100.0)
PLATELETS: 309 10*3/uL (ref 150–440)
RBC: 4.34 MIL/uL (ref 3.80–5.20)
RDW: 14.2 % (ref 11.5–14.5)
WBC: 18.7 10*3/uL — AB (ref 3.6–11.0)

## 2017-05-07 LAB — TYPE AND SCREEN
ABO/RH(D): O POS
ANTIBODY SCREEN: NEGATIVE

## 2017-05-07 MED ORDER — EPHEDRINE 5 MG/ML INJ
10.0000 mg | INTRAVENOUS | Status: DC | PRN
  Filled 2017-05-07: qty 2

## 2017-05-07 MED ORDER — DIPHENHYDRAMINE HCL 50 MG/ML IJ SOLN: 12.5000 mg | INTRAMUSCULAR | Status: DC | PRN

## 2017-05-07 MED ORDER — BUTORPHANOL TARTRATE 1 MG/ML IJ SOLN: 1.0000 mg | INTRAMUSCULAR | Status: DC | PRN

## 2017-05-07 MED ORDER — MISOPROSTOL 200 MCG PO TABS
ORAL_TABLET | ORAL | Status: AC
  Filled 2017-05-07: qty 4

## 2017-05-07 MED ORDER — OXYTOCIN 40 UNITS IN LACTATED RINGERS INFUSION - SIMPLE MED: 2.5000 [IU]/h | INTRAVENOUS | Status: DC

## 2017-05-07 MED ORDER — PHENYLEPHRINE 40 MCG/ML (10ML) SYRINGE FOR IV PUSH (FOR BLOOD PRESSURE SUPPORT)
80.0000 ug | PREFILLED_SYRINGE | INTRAVENOUS | Status: DC | PRN
  Filled 2017-05-07: qty 5

## 2017-05-07 MED ORDER — TERBUTALINE SULFATE 1 MG/ML IJ SOLN: 0.2500 mg | Freq: Once | INTRAMUSCULAR | Status: DC | PRN

## 2017-05-07 MED ORDER — ACETAMINOPHEN 325 MG PO TABS: 650.0000 mg | ORAL_TABLET | ORAL | Status: DC | PRN

## 2017-05-07 MED ORDER — FENTANYL 2.5 MCG/ML W/ROPIVACAINE 0.2% IN NS 100 ML EPIDURAL INFUSION (ARMC-ANES)
10.0000 mL/h | EPIDURAL | Status: DC
  Administered 2017-05-07: 10 mL/h via EPIDURAL

## 2017-05-07 MED ORDER — LIDOCAINE HCL (PF) 1 % IJ SOLN
INTRAMUSCULAR | Status: AC
  Filled 2017-05-07: qty 30

## 2017-05-07 MED ORDER — LACTATED RINGERS IV SOLN: 500.0000 mL | Freq: Once | INTRAVENOUS | Status: DC

## 2017-05-07 MED ORDER — IBUPROFEN 600 MG PO TABS
600.0000 mg | ORAL_TABLET | Freq: Four times a day (QID) | ORAL | Status: DC
  Administered 2017-05-08 – 2017-05-09 (×4): 600 mg via ORAL
  Filled 2017-05-07 (×4): qty 1

## 2017-05-07 MED ORDER — LACTATED RINGERS IV SOLN
INTRAVENOUS | Status: DC
  Administered 2017-05-07: 12:00:00 via INTRAVENOUS

## 2017-05-07 MED ORDER — FENTANYL 2.5 MCG/ML W/ROPIVACAINE 0.2% IN NS 100 ML EPIDURAL INFUSION (ARMC-ANES)
EPIDURAL | Status: AC
  Filled 2017-05-07: qty 100

## 2017-05-07 MED ORDER — AMMONIA AROMATIC IN INHA
RESPIRATORY_TRACT | Status: AC
  Filled 2017-05-07: qty 10

## 2017-05-07 MED ORDER — ONDANSETRON HCL 4 MG/2ML IJ SOLN: 4.0000 mg | Freq: Four times a day (QID) | INTRAMUSCULAR | Status: DC | PRN

## 2017-05-07 MED ORDER — LACTATED RINGERS IV SOLN
500.0000 mL | INTRAVENOUS | Status: DC | PRN
  Administered 2017-05-07: 500 mL via INTRAVENOUS

## 2017-05-07 MED ORDER — SOD CITRATE-CITRIC ACID 500-334 MG/5ML PO SOLN
30.0000 mL | ORAL | Status: DC | PRN
  Filled 2017-05-07: qty 30

## 2017-05-07 MED ORDER — LIDOCAINE HCL (PF) 1 % IJ SOLN: 30.0000 mL | INTRAMUSCULAR | Status: DC | PRN

## 2017-05-07 MED ORDER — BUPIVACAINE HCL (PF) 0.25 % IJ SOLN
INTRAMUSCULAR | Status: DC | PRN
  Administered 2017-05-07: 5 mL via EPIDURAL

## 2017-05-07 MED ORDER — OXYTOCIN BOLUS FROM INFUSION
500.0000 mL | Freq: Once | INTRAVENOUS | Status: AC
  Administered 2017-05-07: 500 mL via INTRAVENOUS

## 2017-05-07 MED ORDER — OXYTOCIN 40 UNITS IN LACTATED RINGERS INFUSION - SIMPLE MED
1.0000 m[IU]/min | INTRAVENOUS | Status: DC
  Administered 2017-05-07: 2 m[IU]/min via INTRAVENOUS
  Filled 2017-05-07: qty 1000

## 2017-05-07 NOTE — Anesthesia Procedure Notes (Signed)
Epidural Patient location during procedure: OB  Staffing Anesthesiologist: Gumecindo Hopkin Performed: anesthesiologist   Preanesthetic Checklist Completed: patient identified, site marked, surgical consent, pre-op evaluation, timeout performed, IV checked, risks and benefits discussed and monitors and equipment checked  Epidural Patient position: sitting Prep: Betadine Patient monitoring: heart rate, continuous pulse ox and blood pressure Approach: midline Location: L4-L5 Injection technique: LOR saline  Needle:  Needle type: Tuohy  Needle gauge: 18 G Needle length: 9 cm and 9 Catheter type: closed end flexible Catheter size: 20 Guage Test dose: negative and 1.5% lidocaine with Epi 1:200 K  Assessment Sensory level: T10 Events: blood not aspirated, injection not painful, no injection resistance, negative IV test and no paresthesia  Additional Notes   Patient tolerated the insertion well without complications.Reason for block:procedure for pain     

## 2017-05-07 NOTE — Progress Notes (Signed)
Subjective:  Feeling increasing strength to contractions Objective:   Vitals: Blood pressure 127/86, pulse 96, temperature 98.5 F (36.9 C), temperature source Oral, resp. rate 16, height 5\' 9"  (1.753 m), weight 229 lb (103.9 kg), last menstrual period 07/24/2016, SpO2 100 %. General: NAD Abdomen: gravid, non-tender Cervical Exam:  Dilation: 4 Effacement (%): 60 Cervical Position: Anterior Station: -2 Presentation: Vertex Exam by:: Robin Searing RN  AROM clear fluid  FHT: 130, moderate, +accles, no decels Toco: q2-22min  Results for orders placed or performed during the hospital encounter of 05/07/17 (from the past 24 hour(s))  CBC     Status: Abnormal   Collection Time: 05/07/17 12:20 PM  Result Value Ref Range   WBC 18.7 (H) 3.6 - 11.0 K/uL   RBC 4.34 3.80 - 5.20 MIL/uL   Hemoglobin 12.0 12.0 - 16.0 g/dL   HCT 35.8 35.0 - 47.0 %   MCV 82.3 80.0 - 100.0 fL   MCH 27.7 26.0 - 34.0 pg   MCHC 33.7 32.0 - 36.0 g/dL   RDW 14.2 11.5 - 14.5 %   Platelets 309 150 - 440 K/uL  Type and screen [place]     Status: None   Collection Time: 05/07/17 12:20 PM  Result Value Ref Range   ABO/RH(D) O POS    Antibody Screen NEG    Sample Expiration 05/10/2017     Assessment:   26 y.o. G2P1001 [redacted]w[redacted]d IOL postdates  Plan:   1) Labor - continue pitocin, AROM clear fluid  2) Fetus - cat I tracing

## 2017-05-07 NOTE — H&P (Signed)
Date of Initial H&P: 05/02/2017 (Titeled progress note from clinic visit)  History reviewed, patient examined, no change in status, stable for postdates induction.  Pelvis tested to 7lbs 13oz, 32lbs weight gain this pregnancy   Clinic [facility] Prenatal Labs  Dating LMP=7wk6d [place] Blood type: O/Positive/-- (10/18 0000)   Genetic Screen 1 Screen:    AFP:     Quad:     NIPS: Antibody:Negative (10/18 0000)  Anatomic [place] Complete 01/17/2017 Rubella: Immune (10/18 0000)  GTT Third trimester: 110 RPR: Non Reactive (03/21 0935)   Feeding Plan Breast HBsAg: Negative (10/18 0000)   TDaP vaccine  03/16/2017           Rhogam: N/A HIV: Non Reactive (03/21 0935)   Melanoma  Placenta to pathology                                     GBS: Negative (For PCN allergy, check sensitivities): negative 5/10  Contraception  Paragard Pap:11/03/2013 NIL  Circumcision  N/A Varicella: immune

## 2017-05-07 NOTE — Anesthesia Preprocedure Evaluation (Signed)
Anesthesia Evaluation  Patient identified by MRN, date of birth, ID band Patient awake    Reviewed: Allergy & Precautions, NPO status , Patient's Chart, lab work & pertinent test results, reviewed documented beta blocker date and time   Airway Mallampati: II  TM Distance: >3 FB     Dental  (+) Chipped   Pulmonary asthma , former smoker,           Cardiovascular      Neuro/Psych  Headaches,    GI/Hepatic   Endo/Other    Renal/GU      Musculoskeletal   Abdominal   Peds  Hematology   Anesthesia Other Findings Melenoma. Splenectomy.  Reproductive/Obstetrics                             Anesthesia Physical Anesthesia Plan  ASA: III  Anesthesia Plan: Epidural   Post-op Pain Management:    Induction:   PONV Risk Score and Plan:   Airway Management Planned:   Additional Equipment:   Intra-op Plan:   Post-operative Plan:   Informed Consent: I have reviewed the patients History and Physical, chart, labs and discussed the procedure including the risks, benefits and alternatives for the proposed anesthesia with the patient or authorized representative who has indicated his/her understanding and acceptance.     Plan Discussed with: CRNA  Anesthesia Plan Comments:         Anesthesia Quick Evaluation

## 2017-05-07 NOTE — Discharge Summary (Signed)
Obstetric Discharge Summary Reason for Admission: induction of labor Prenatal Procedures: none Intrapartum Procedures: spontaneous vaginal delivery Postpartum Procedures: none Complications-Operative and Postpartum: none Hemoglobin  Date Value Ref Range Status  05/08/2017 11.2 (L) 12.0 - 16.0 g/dL Final  02/14/2017 12.4 11.1 - 15.9 g/dL Final  09/13/2016 14.6 g/dL Final   HCT  Date Value Ref Range Status  05/08/2017 32.8 (L) 35.0 - 47.0 % Final  09/13/2016 43 % Final   Hematocrit  Date Value Ref Range Status  02/14/2017 36.5 34.0 - 46.6 % Final    Physical Exam:  General: alert and cooperative Lochia: appropriate Uterine Fundus: firm Incision: none DVT Evaluation: No evidence of DVT seen on physical exam. Negative Homan's sign.  Discharge Diagnoses: Term Pregnancy-delivered  Discharge Information: Date: 05/09/2017 Activity: pelvic rest Diet: routine Allergies as of 05/09/2017      Reactions   Tape Other (See Comments)   erythema      Medication List    TAKE these medications   acetaminophen 325 MG tablet Commonly known as:  TYLENOL Take 650 mg by mouth every 6 (six) hours as needed.   albuterol 108 (90 Base) MCG/ACT inhaler Commonly known as:  PROVENTIL HFA;VENTOLIN HFA Inhale 1-2 puffs into the lungs every 6 (six) hours as needed for wheezing or shortness of breath.   loratadine 10 MG tablet Commonly known as:  CLARITIN Take 10 mg by mouth daily.   prenatal multivitamin Tabs tablet Take 1 tablet by mouth daily at 12 noon.      Condition: stable Discharge to: home Follow-up Information    Malachy Mood, MD Follow up in 6 week(s).   Specialty:  Obstetrics and Gynecology Why:  please call and make your own 6 week follow up appointment Contact information: [place] [id number] [phone]         Plans IUD for birth control  Newborn Data: Live born unspecified sex  Birth Weight:  9-5 lbs APGAR: 9, 9  Home with  mother.  Hoyt Koch 05/09/2017, 9:49 AM

## 2017-05-07 NOTE — Progress Notes (Signed)
Subjective:  Feeling more pressure  Objective:   Vitals: Blood pressure 127/86, pulse 96, temperature 98.4 F (36.9 C), temperature source Oral, resp. rate 16, height 5\' 9"  (1.753 m), weight 229 lb (103.9 kg), last menstrual period 07/24/2016, SpO2 100 %. General:  Abdomen: Cervical Exam:  Dilation: 5.5 Effacement (%): 70 Cervical Position: Anterior Station: -1 Presentation: Vertex Exam by:: Dr. Georgianne Fick  FHT: 130, moderate, +Accels, no decels Toco:q2-98min  Results for orders placed or performed during the hospital encounter of 05/07/17 (from the past 24 hour(s))  CBC     Status: Abnormal   Collection Time: 05/07/17 12:20 PM  Result Value Ref Range   WBC 18.7 (H) 3.6 - 11.0 K/uL   RBC 4.34 3.80 - 5.20 MIL/uL   Hemoglobin 12.0 12.0 - 16.0 g/dL   HCT 35.8 35.0 - 47.0 %   MCV 82.3 80.0 - 100.0 fL   MCH 27.7 26.0 - 34.0 pg   MCHC 33.7 32.0 - 36.0 g/dL   RDW 14.2 11.5 - 14.5 %   Platelets 309 150 - 440 K/uL  Type and screen [place]     Status: None   Collection Time: 05/07/17 12:20 PM  Result Value Ref Range   ABO/RH(D) O POS    Antibody Screen NEG    Sample Expiration 05/10/2017     Assessment:   26 y.o. G2P1001 [redacted]w[redacted]d IOL postdates  Plan:   1) Labor - continue pitocin  2) Fetus - cat I tracing

## 2017-05-08 LAB — CHLAMYDIA/NGC RT PCR (ARMC ONLY)
Chlamydia Tr: NOT DETECTED
N gonorrhoeae: NOT DETECTED

## 2017-05-08 LAB — CBC
HCT: 32.8 % — ABNORMAL LOW (ref 35.0–47.0)
Hemoglobin: 11.2 g/dL — ABNORMAL LOW (ref 12.0–16.0)
MCH: 27.8 pg (ref 26.0–34.0)
MCHC: 34.1 g/dL (ref 32.0–36.0)
MCV: 81.7 fL (ref 80.0–100.0)
PLATELETS: 288 10*3/uL (ref 150–440)
RBC: 4.01 MIL/uL (ref 3.80–5.20)
RDW: 14 % (ref 11.5–14.5)
WBC: 28.2 10*3/uL — AB (ref 3.6–11.0)

## 2017-05-08 LAB — RPR: RPR: NONREACTIVE

## 2017-05-08 MED ORDER — ONDANSETRON HCL 4 MG PO TABS: 4.0000 mg | ORAL_TABLET | ORAL | Status: DC | PRN

## 2017-05-08 MED ORDER — COCONUT OIL OIL
1.0000 "application " | TOPICAL_OIL | Status: DC | PRN
  Filled 2017-05-08: qty 120

## 2017-05-08 MED ORDER — ACETAMINOPHEN 325 MG PO TABS: 650.0000 mg | ORAL_TABLET | ORAL | Status: DC | PRN

## 2017-05-08 MED ORDER — PRENATAL MULTIVITAMIN CH
1.0000 | ORAL_TABLET | Freq: Every day | ORAL | Status: DC
  Administered 2017-05-08: 1 via ORAL
  Filled 2017-05-08: qty 1

## 2017-05-08 MED ORDER — ALBUTEROL SULFATE (2.5 MG/3ML) 0.083% IN NEBU: 3.0000 mL | INHALATION_SOLUTION | Freq: Four times a day (QID) | RESPIRATORY_TRACT | Status: DC | PRN

## 2017-05-08 MED ORDER — DIBUCAINE 1 % RE OINT: 1.0000 "application " | TOPICAL_OINTMENT | RECTAL | Status: DC | PRN

## 2017-05-08 MED ORDER — DIPHENHYDRAMINE HCL 25 MG PO CAPS: 25.0000 mg | ORAL_CAPSULE | Freq: Four times a day (QID) | ORAL | Status: DC | PRN

## 2017-05-08 MED ORDER — WITCH HAZEL-GLYCERIN EX PADS: 1.0000 "application " | MEDICATED_PAD | CUTANEOUS | Status: DC | PRN

## 2017-05-08 MED ORDER — OXYCODONE-ACETAMINOPHEN 5-325 MG PO TABS: 1.0000 | ORAL_TABLET | ORAL | Status: DC | PRN

## 2017-05-08 MED ORDER — SENNOSIDES-DOCUSATE SODIUM 8.6-50 MG PO TABS
2.0000 | ORAL_TABLET | ORAL | Status: DC
  Administered 2017-05-08: 2 via ORAL
  Filled 2017-05-08: qty 2

## 2017-05-08 MED ORDER — OXYCODONE-ACETAMINOPHEN 5-325 MG PO TABS: 2.0000 | ORAL_TABLET | ORAL | Status: DC | PRN

## 2017-05-08 MED ORDER — BENZOCAINE-MENTHOL 20-0.5 % EX AERO: 1.0000 "application " | INHALATION_SPRAY | CUTANEOUS | Status: DC | PRN

## 2017-05-08 MED ORDER — ONDANSETRON HCL 4 MG/2ML IJ SOLN: 4.0000 mg | INTRAMUSCULAR | Status: DC | PRN

## 2017-05-08 MED ORDER — SIMETHICONE 80 MG PO CHEW: 80.0000 mg | CHEWABLE_TABLET | ORAL | Status: DC | PRN

## 2017-05-08 NOTE — Anesthesia Postprocedure Evaluation (Signed)
Anesthesia Post Note  Patient: Erika Barnes  Procedure(s) Performed: * No procedures listed *  Patient location during evaluation: Mother Baby Anesthesia Type: Epidural Level of consciousness: awake and alert Pain management: pain level controlled Vital Signs Assessment: post-procedure vital signs reviewed and stable Respiratory status: spontaneous breathing, nonlabored ventilation and respiratory function stable Cardiovascular status: stable Postop Assessment: no headache, no backache and epidural receding Anesthetic complications: no     Last Vitals:  Vitals:   05/08/17 0045 05/08/17 0426  BP: 112/63 134/74  Pulse: 90 85  Resp: 18 18  Temp: 36.9 C 36.8 C    Last Pain:  Vitals:   05/08/17 0426  TempSrc: Oral  PainSc:                  Virgilio Frees

## 2017-05-08 NOTE — Progress Notes (Signed)
Post Partum Day 1 (12 hours postpartum) Subjective: no complaints, up ad lib, voiding and tolerating PO . Not much sleep. Breast feeding Objective: Blood pressure 96/62, pulse 91, temperature 98.3 F (36.8 C), temperature source Oral, resp. rate 16, height 5\' 9"  (1.753 m), weight 103.9 kg (229 lb), last menstrual period 07/24/2016, SpO2 95 %.  Physical Exam:  General: alert, cooperative and no distress Lochia: appropriate Uterine Fundus: firm/ U-1/ ML/ NT Incision: NA DVT Evaluation: No evidence of DVT seen on physical exam.   Recent Labs  05/07/17 1220 05/08/17 0522  HGB 12.0 11.2*  HCT 35.8 32.8*  WBC 18.7* 28.2*  PLT 309 288    Assessment/Plan: Stable PPD #1 Continue postpartum care Plan for discharge tomorrow O POS/ RI/ VI/ GBS negative TDAP UTD Breast/ Paraguard   LOS: 1 day   Dalia Heading 05/08/2017, 11:39 AM

## 2017-05-09 NOTE — Progress Notes (Signed)
Post Partum Day 2 Subjective: no complaints, up ad lib, voiding and tolerating PO .  Breast feeding Objective: Blood pressure 126/85, pulse 76, temperature 98.1 F (36.7 C), temperature source Oral, resp. rate 18, height 5\' 9"  (1.753 m), weight 229 lb (103.9 kg), last menstrual period 07/24/2016, SpO2 98 %, unknown if currently breastfeeding.  Physical Exam:  General: alert, cooperative and no distress Lochia: appropriate Uterine Fundus: firm/ U-1/ ML/ NT Incision: NA DVT Evaluation: No evidence of DVT seen on physical exam.   Recent Labs  05/07/17 1220 05/08/17 0522  HGB 12.0 11.2*  HCT 35.8 32.8*  WBC 18.7* 28.2*  PLT 309 288    Assessment/Plan: Stable PPD #2 Continue postpartum care Discharge home and Breastfeeding O POS/ RI/ VI/ GBS negative TDAP UTD Breast/ Paraguard   LOS: 2 days   Hoyt Koch 05/09/2017, 9:51 AM

## 2017-05-09 NOTE — Discharge Instructions (Signed)
Vaginal Delivery, Care After °Refer to this sheet in the next few weeks. These instructions provide you with information about caring for yourself after vaginal delivery. Your health care provider may also give you more specific instructions. Your treatment has been planned according to current medical practices, but problems sometimes occur. Call your health care provider if you have any problems or questions. °What can I expect after the procedure? °After vaginal delivery, it is common to have: °· Some bleeding from your vagina. °· Soreness in your abdomen, your vagina, and the area of skin between your vaginal opening and your anus (perineum). °· Pelvic cramps. °· Fatigue. ° °Follow these instructions at home: °Medicines °· Take over-the-counter and prescription medicines only as told by your health care provider. °· If you were prescribed an antibiotic medicine, take it as told by your health care provider. Do not stop taking the antibiotic until it is finished. °Driving ° °· Do not drive or operate heavy machinery while taking prescription pain medicine. °· Do not drive for 24 hours if you received a sedative. °Lifestyle °· Do not drink alcohol. This is especially important if you are breastfeeding or taking medicine to relieve pain. °· Do not use tobacco products, including cigarettes, chewing tobacco, or e-cigarettes. If you need help quitting, ask your health care provider. °Eating and drinking °· Drink at least 8 eight-ounce glasses of water every day unless you are told not to by your health care provider. If you choose to breastfeed your baby, you may need to drink more water than this. °· Eat high-fiber foods every day. These foods may help prevent or relieve constipation. High-fiber foods include: °? Whole grain cereals and breads. °? Brown rice. °? Beans. °? Fresh fruits and vegetables. °Activity °· Return to your normal activities as told by your health care provider. Ask your health care provider  what activities are safe for you. °· Rest as much as possible. Try to rest or take a nap when your baby is sleeping. °· Do not lift anything that is heavier than your baby or 10 lb (4.5 kg) until your health care provider says that it is safe. °· Talk with your health care provider about when you can engage in sexual activity. This may depend on your: °? Risk of infection. °? Rate of healing. °? Comfort and desire to engage in sexual activity. °Vaginal Care °· If you have an episiotomy or a vaginal tear, check the area every day for signs of infection. Check for: °? More redness, swelling, or pain. °? More fluid or blood. °? Warmth. °? Pus or a bad smell. °· Do not use tampons or douches until your health care provider says this is safe. °· Watch for any blood clots that may pass from your vagina. These may look like clumps of dark red, brown, or black discharge. °General instructions °· Keep your perineum clean and dry as told by your health care provider. °· Wear loose, comfortable clothing. °· Wipe from front to back when you use the toilet. °· Ask your health care provider if you can shower or take a bath. If you had an episiotomy or a perineal tear during labor and delivery, your health care provider may tell you not to take baths for a certain length of time. °· Wear a bra that supports your breasts and fits you well. °· If possible, have someone help you with household activities and help care for your baby for at least a few days after   you leave the hospital.  Keep all follow-up visits for you and your baby as told by your health care provider. This is important. Contact a health care provider if:  You have: ? Vaginal discharge that has a bad smell. ? Difficulty urinating. ? Pain when urinating. ? A sudden increase or decrease in the frequency of your bowel movements. ? More redness, swelling, or pain around your episiotomy or vaginal tear. ? More fluid or blood coming from your episiotomy or  vaginal tear. ? Pus or a bad smell coming from your episiotomy or vaginal tear. ? A fever. ? A rash. ? Little or no interest in activities you used to enjoy. ? Questions about caring for yourself or your baby.  Your episiotomy or vaginal tear feels warm to the touch.  Your episiotomy or vaginal tear is separating or does not appear to be healing.  Your breasts are painful, hard, or turn red.  You feel unusually sad or worried.  You feel nauseous or you vomit.  You pass large blood clots from your vagina. If you pass a blood clot from your vagina, save it to show to your health care provider. Do not flush blood clots down the toilet without having your health care provider look at them.  You urinate more than usual.  You are dizzy or light-headed.  You have not breastfed at all and you have not had a menstrual period for 12 weeks after delivery.  You have stopped breastfeeding and you have not had a menstrual period for 12 weeks after you stopped breastfeeding. Get help right away if:  You have: ? Pain that does not go away or does not get better with medicine. ? Chest pain. ? Difficulty breathing. ? Blurred vision or spots in your vision. ? Thoughts about hurting yourself or your baby.  You develop pain in your abdomen or in one of your legs.  You develop a severe headache.  You faint.  You bleed from your vagina so much that you fill two sanitary pads in one hour. This information is not intended to replace advice given to you by your health care provider. Make sure you discuss any questions you have with your health care provider. Document Released: 11/10/2000 Document Revised: 04/26/2016 Document Reviewed: 11/28/2015 Elsevier Interactive Patient Education  2017 [employer].   Breastfeeding Deciding to breastfeed is one of the best choices you can make for you and your baby. A change in hormones during pregnancy causes your breast tissue to grow and increases the  number and size of your milk ducts. These hormones also allow proteins, sugars, and fats from your blood supply to make breast milk in your milk-producing glands. Hormones prevent breast milk from being released before your baby is born as well as prompt milk flow after birth. Once breastfeeding has begun, thoughts of your baby, as well as his or her sucking or crying, can stimulate the release of milk from your milk-producing glands. Benefits of breastfeeding For Your Baby  Your first milk (colostrum) helps your baby's digestive system function better.  There are antibodies in your milk that help your baby fight off infections.  Your baby has a lower incidence of asthma, allergies, and sudden infant death syndrome.  The nutrients in breast milk are better for your baby than infant formulas and are designed uniquely for your babys needs.  Breast milk improves your baby's brain development.  Your baby is less likely to develop other conditions, such as childhood obesity,  asthma, or type 2 diabetes mellitus.  For You  Breastfeeding helps to create a very special bond between you and your baby.  Breastfeeding is convenient. Breast milk is always available at the correct temperature and costs nothing.  Breastfeeding helps to burn calories and helps you lose the weight gained during pregnancy.  Breastfeeding makes your uterus contract to its prepregnancy size faster and slows bleeding (lochia) after you give birth.  Breastfeeding helps to lower your risk of developing type 2 diabetes mellitus, osteoporosis, and breast or ovarian cancer later in life.  Signs that your baby is hungry Early Signs of Hunger  Increased alertness or activity.  Stretching.  Movement of the head from side to side.  Movement of the head and opening of the mouth when the corner of the mouth or cheek is stroked (rooting).  Increased sucking sounds, smacking lips, cooing, sighing, or  squeaking.  Hand-to-mouth movements.  Increased sucking of fingers or hands.  Late Signs of Hunger  Fussing.  Intermittent crying.  Extreme Signs of Hunger Signs of extreme hunger will require calming and consoling before your baby will be able to breastfeed successfully. Do not wait for the following signs of extreme hunger to occur before you initiate breastfeeding:  Restlessness.  A loud, strong cry.  Screaming.  Breastfeeding basics Breastfeeding Initiation  Find a comfortable place to sit or lie down, with your neck and back well supported.  Place a pillow or rolled up blanket under your baby to bring him or her to the level of your breast (if you are seated). Nursing pillows are specially designed to help support your arms and your baby while you breastfeed.  Make sure that your baby's abdomen is facing your abdomen.  Gently massage your breast. With your fingertips, massage from your chest wall toward your nipple in a circular motion. This encourages milk flow. You may need to continue this action during the feeding if your milk flows slowly.  Support your breast with 4 fingers underneath and your thumb above your nipple. Make sure your fingers are well away from your nipple and your babys mouth.  Stroke your baby's lips gently with your finger or nipple.  When your baby's mouth is open wide enough, quickly bring your baby to your breast, placing your entire nipple and as much of the colored area around your nipple (areola) as possible into your baby's mouth. ? More areola should be visible above your baby's upper lip than below the lower lip. ? Your baby's tongue should be between his or her lower gum and your breast.  Ensure that your baby's mouth is correctly positioned around your nipple (latched). Your baby's lips should create a seal on your breast and be turned out (everted).  It is common for your baby to suck about 2-3 minutes in order to start the flow of  breast milk.  Latching Teaching your baby how to latch on to your breast properly is very important. An improper latch can cause nipple pain and decreased milk supply for you and poor weight gain in your baby. Also, if your baby is not latched onto your nipple properly, he or she may swallow some air during feeding. This can make your baby fussy. Burping your baby when you switch breasts during the feeding can help to get rid of the air. However, teaching your baby to latch on properly is still the best way to prevent fussiness from swallowing air while breastfeeding. Signs that your baby has successfully  latched on to your nipple:  Silent tugging or silent sucking, without causing you pain.  Swallowing heard between every 3-4 sucks.  Muscle movement above and in front of his or her ears while sucking.  Signs that your baby has not successfully latched on to nipple:  Sucking sounds or smacking sounds from your baby while breastfeeding.  Nipple pain.  If you think your baby has not latched on correctly, slip your finger into the corner of your babys mouth to break the suction and place it between your baby's gums. Attempt breastfeeding initiation again. Signs of Successful Breastfeeding Signs from your baby:  A gradual decrease in the number of sucks or complete cessation of sucking.  Falling asleep.  Relaxation of his or her body.  Retention of a small amount of milk in his or her mouth.  Letting go of your breast by himself or herself.  Signs from you:  Breasts that have increased in firmness, weight, and size 1-3 hours after feeding.  Breasts that are softer immediately after breastfeeding.  Increased milk volume, as well as a change in milk consistency and color by the fifth day of breastfeeding.  Nipples that are not sore, cracked, or bleeding.  Signs That Your [employer] is Getting Enough Milk  Wetting at least 1-2 diapers during the first 24 hours after birth.  Wetting  at least 5-6 diapers every 24 hours for the first week after birth. The urine should be clear or pale yellow by 5 days after birth.  Wetting 6-8 diapers every 24 hours as your baby continues to grow and develop.  At least 3 stools in a 24-hour period by age 38 days. The stool should be soft and yellow.  At least 3 stools in a 24-hour period by age 73 days. The stool should be seedy and yellow.  No loss of weight greater than 10% of birth weight during the first 74 days of age.  Average weight gain of 4-7 ounces (113-198 g) per week after age 26 days.  Consistent daily weight gain by age 389 days, without weight loss after the age of 2 weeks.  After a feeding, your baby may spit up a small amount. This is common. Breastfeeding frequency and duration Frequent feeding will help you make more milk and can prevent sore nipples and breast engorgement. Breastfeed when you feel the need to reduce the fullness of your breasts or when your baby shows signs of hunger. This is called "breastfeeding on demand." Avoid introducing a pacifier to your baby while you are working to establish breastfeeding (the first 4-6 weeks after your baby is born). After this time you may choose to use a pacifier. Research has shown that pacifier use during the first year of a baby's life decreases the risk of sudden infant death syndrome (SIDS). Allow your baby to feed on each breast as long as he or she wants. Breastfeed until your baby is finished feeding. When your baby unlatches or falls asleep while feeding from the first breast, offer the second breast. Because newborns are often sleepy in the first few weeks of life, you may need to awaken your baby to get him or her to feed. Breastfeeding times will vary from baby to baby. However, the following rules can serve as a guide to help you ensure that your baby is properly fed:  Newborns (babies 18 weeks of age or younger) may breastfeed every 1-3 hours.  Newborns should not go  longer than 3 hours during  the day or 5 hours during the night without breastfeeding.  You should breastfeed your baby a minimum of 8 times in a 24-hour period until you begin to introduce solid foods to your baby at around 40 months of age.  Breast milk pumping Pumping and storing breast milk allows you to ensure that your baby is exclusively fed your breast milk, even at times when you are unable to breastfeed. This is especially important if you are going back to work while you are still breastfeeding or when you are not able to be present during feedings. Your lactation consultant can give you guidelines on how long it is safe to store breast milk. A breast pump is a machine that allows you to pump milk from your breast into a sterile bottle. The pumped breast milk can then be stored in a refrigerator or freezer. Some breast pumps are operated by hand, while others use electricity. Ask your lactation consultant which type will work best for you. Breast pumps can be purchased, but some hospitals and breastfeeding support groups lease breast pumps on a monthly basis. A lactation consultant can teach you how to hand express breast milk, if you prefer not to use a pump. Caring for your breasts while you breastfeed Nipples can become dry, cracked, and sore while breastfeeding. The following recommendations can help keep your breasts moisturized and healthy:  Avoid using soap on your nipples.  Wear a supportive bra. Although not required, special nursing bras and tank tops are designed to allow access to your breasts for breastfeeding without taking off your entire bra or top. Avoid wearing underwire-style bras or extremely tight bras.  Air dry your nipples for 3-11minutes after each feeding.  Use only cotton bra pads to absorb leaked breast milk. Leaking of breast milk between feedings is normal.  Use lanolin on your nipples after breastfeeding. Lanolin helps to maintain your skin's normal moisture  barrier. If you use pure lanolin, you do not need to wash it off before feeding your baby again. Pure lanolin is not toxic to your baby. You may also hand express a few drops of breast milk and gently massage that milk into your nipples and allow the milk to air dry.  In the first few weeks after giving birth, some women experience extremely full breasts (engorgement). Engorgement can make your breasts feel heavy, warm, and tender to the touch. Engorgement peaks within 3-5 days after you give birth. The following recommendations can help ease engorgement:  Completely empty your breasts while breastfeeding or pumping. You may want to start by applying warm, moist heat (in the shower or with warm water-soaked hand towels) just before feeding or pumping. This increases circulation and helps the milk flow. If your baby does not completely empty your breasts while breastfeeding, pump any extra milk after he or she is finished.  Wear a snug bra (nursing or regular) or tank top for 1-2 days to signal your body to slightly decrease milk production.  Apply ice packs to your breasts, unless this is too uncomfortable for you.  Make sure that your baby is latched on and positioned properly while breastfeeding.  If engorgement persists after 48 hours of following these recommendations, contact your health care provider or a Science writer. Overall health care recommendations while breastfeeding  Eat healthy foods. Alternate between meals and snacks, eating 3 of each per day. Because what you eat affects your breast milk, some of the foods may make your baby more irritable than  usual. Avoid eating these foods if you are sure that they are negatively affecting your baby.  Drink milk, fruit juice, and water to satisfy your thirst (about 10 glasses a day).  Rest often, relax, and continue to take your prenatal vitamins to prevent fatigue, stress, and anemia.  Continue breast self-awareness checks.  Avoid  chewing and smoking tobacco. Chemicals from cigarettes that pass into breast milk and exposure to secondhand smoke may harm your baby.  Avoid alcohol and drug use, including marijuana. Some medicines that may be harmful to your baby can pass through breast milk. It is important to ask your health care provider before taking any medicine, including all over-the-counter and prescription medicine as well as vitamin and herbal supplements. It is possible to become pregnant while breastfeeding. If birth control is desired, ask your health care provider about options that will be safe for your baby. Contact a health care provider if:  You feel like you want to stop breastfeeding or have become frustrated with breastfeeding.  You have painful breasts or nipples.  Your nipples are cracked or bleeding.  Your breasts are red, tender, or warm.  You have a swollen area on either breast.  You have a fever or chills.  You have nausea or vomiting.  You have drainage other than breast milk from your nipples.  Your breasts do not become full before feedings by the fifth day after you give birth.  You feel sad and depressed.  Your baby is too sleepy to eat well.  Your baby is having trouble sleeping.  Your baby is wetting less than 3 diapers in a 24-hour period.  Your baby has less than 3 stools in a 24-hour period.  Your baby's skin or the white part of his or her eyes becomes yellow.  Your baby is not gaining weight by 36 days of age. Get help right away if:  Your baby is overly tired (lethargic) and does not want to wake up and feed.  Your baby develops an unexplained fever. This information is not intended to replace advice given to you by your health care provider. Make sure you discuss any questions you have with your health care provider. Document Released: 11/13/2005 Document Revised: 04/26/2016 Document Reviewed: 05/07/2013 Elsevier Interactive Patient Education  2017 [employer].

## 2017-05-09 NOTE — Progress Notes (Signed)
Reviewed all patients discharge instructions and handouts regarding postpartum bleeding, no intercourse for 6 weeks, signs and symptoms of mastitis and postpartum bleu's. Reviewed discharge instructions for newborn regarding proper cord care, how and when to bathe the newborn, nail care, proper way to take the baby's temperature, along with safe sleep. All questions have been answered at this time. Patient discharged via wheelchair with axillary.  

## 2017-05-10 LAB — SURGICAL PATHOLOGY

## 2017-06-05 ENCOUNTER — Telehealth: Payer: Self-pay | Admitting: Obstetrics and Gynecology

## 2017-06-05 NOTE — Telephone Encounter (Signed)
Pt coming 7/27 at 10:30 for 6wpp and paraguard insert

## 2017-06-05 NOTE — Telephone Encounter (Signed)
Noted. Will order to arrive by apt date/time. 

## 2017-06-22 ENCOUNTER — Encounter: Payer: Self-pay | Admitting: Obstetrics and Gynecology

## 2017-06-22 ENCOUNTER — Ambulatory Visit (INDEPENDENT_AMBULATORY_CARE_PROVIDER_SITE_OTHER): Payer: BLUE CROSS/BLUE SHIELD | Admitting: Obstetrics and Gynecology

## 2017-06-22 DIAGNOSIS — Z30430 Encounter for insertion of intrauterine contraceptive device: Secondary | ICD-10-CM

## 2017-06-22 LAB — POCT URINE PREGNANCY: PREG TEST UR: NEGATIVE
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
## 2017-06-22 NOTE — Telephone Encounter (Signed)
Paragard stock reserved for this patient

## 2017-06-26 LAB — PAP IG W/ RFLX HPV ASCU: PAP Smear Comment: 0

## 2017-08-03 ENCOUNTER — Encounter: Payer: Self-pay | Admitting: Obstetrics and Gynecology

## 2017-08-03 ENCOUNTER — Ambulatory Visit: Payer: BLUE CROSS/BLUE SHIELD | Admitting: Obstetrics and Gynecology

## 2017-08-03 ENCOUNTER — Ambulatory Visit (INDEPENDENT_AMBULATORY_CARE_PROVIDER_SITE_OTHER): Payer: BLUE CROSS/BLUE SHIELD | Admitting: Obstetrics and Gynecology

## 2017-08-03 VITALS — BP 116/80 | HR 115 | Wt 199.0 lb

## 2017-08-03 DIAGNOSIS — Z30431 Encounter for routine checking of intrauterine contraceptive device: Secondary | ICD-10-CM | POA: Diagnosis not present

## 2017-08-04 IMAGING — US US CAROTID DUPLEX BILAT
1 series · 13 of 24 positions shown · non-contrast
Comparison: No prior duplex

CLINICAL DATA: 25-year-old female with a history of vision changes.

Cardiovascular risk factors include tobacco use
EXAM:
BILATERAL CAROTID DUPLEX ULTRASOUND
TECHNIQUE: Gray scale imaging, color Doppler and duplex ultrasound were
performed of bilateral carotid and vertebral arteries in the neck.

[Series 1: us carotid duplex bilat · 13 of 62 slices shown]
[im 1/62]
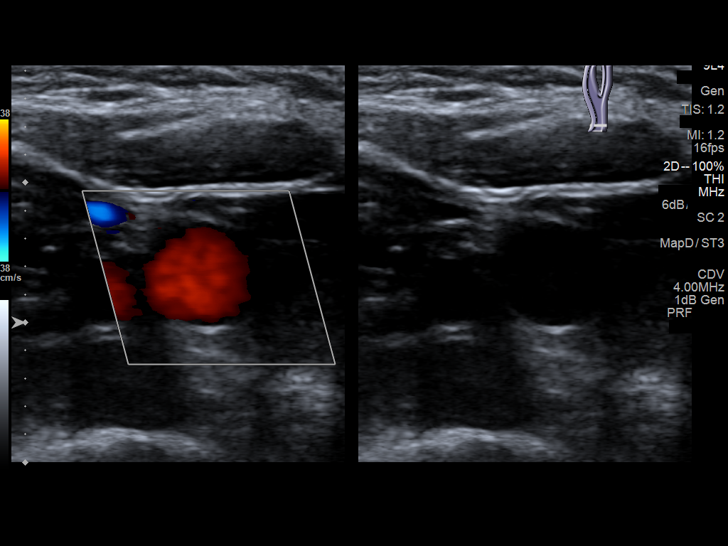
[im 6/62]
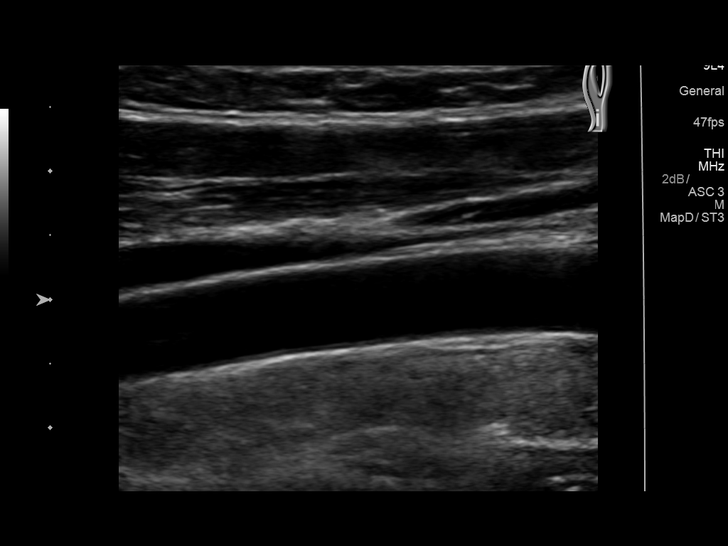
[im 11/62]
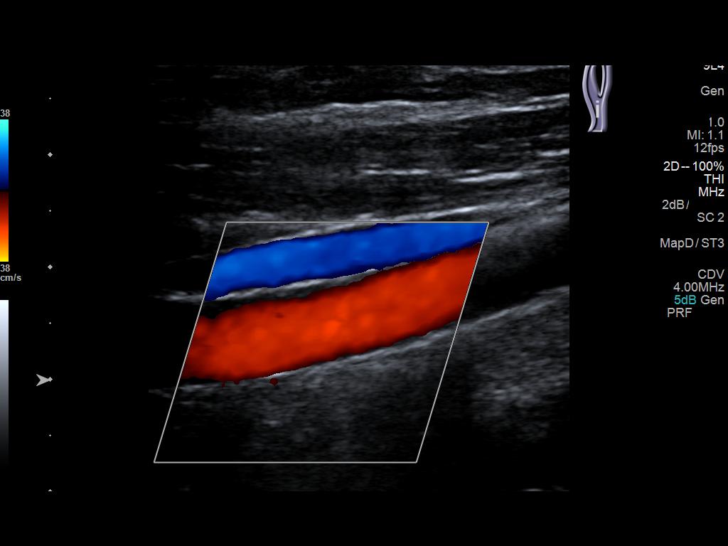
[im 16/62]
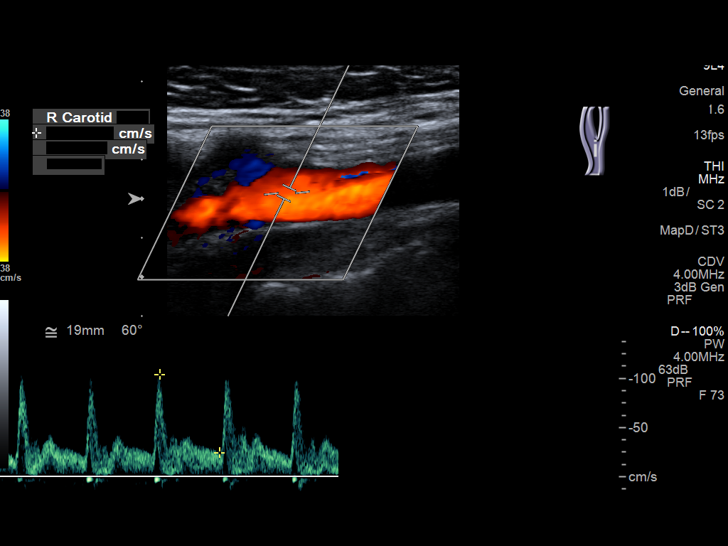
[im 22/62]
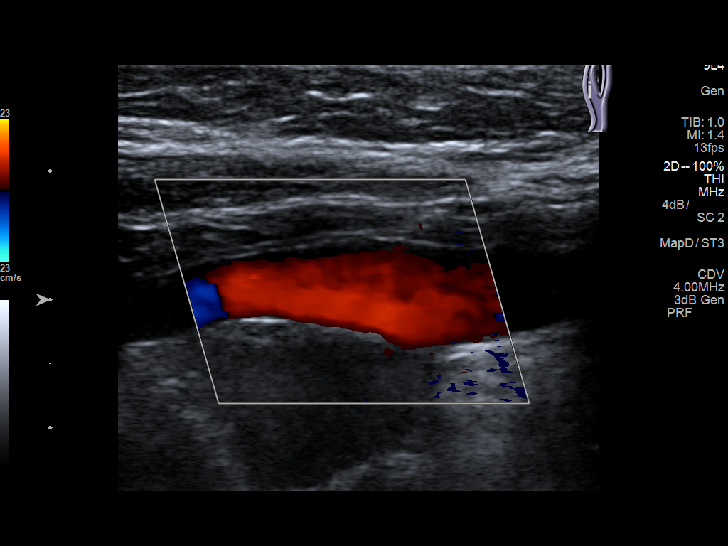
[im 27/62]
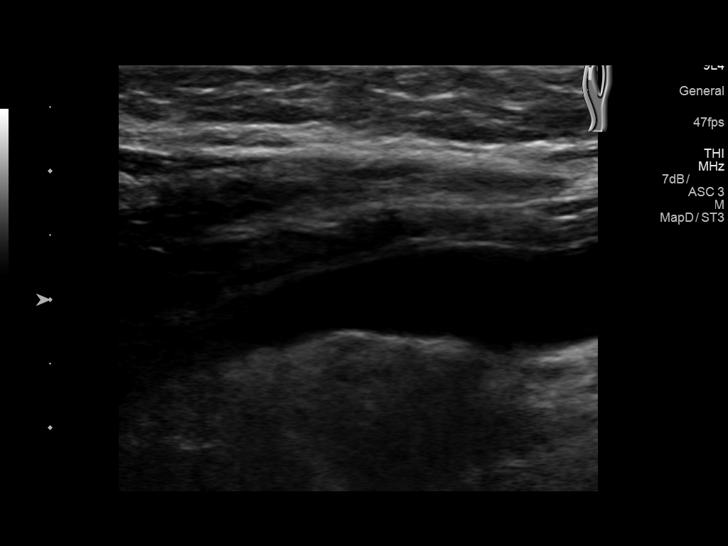
[im 32/62]
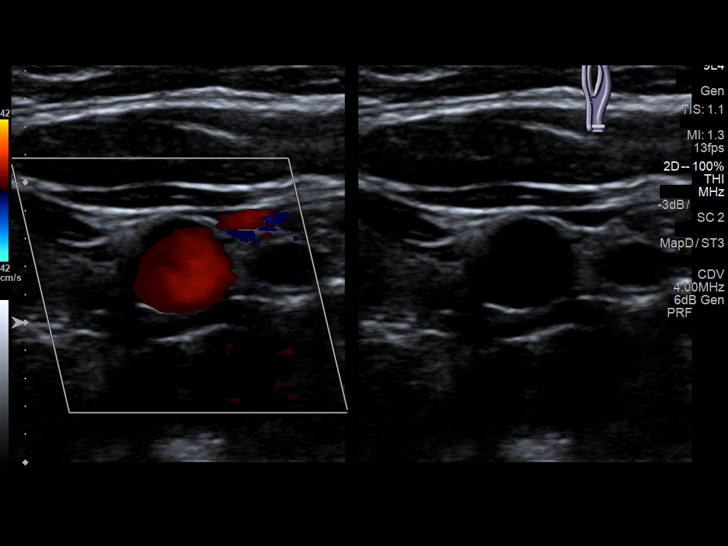
[im 35/62]
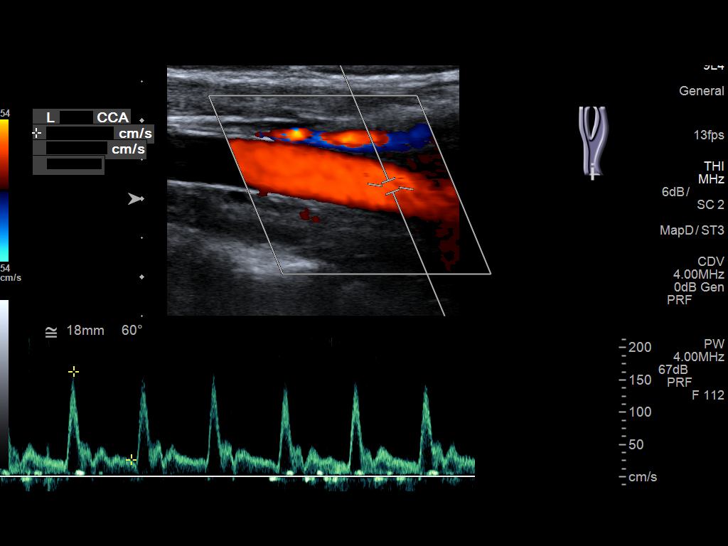
[im 40/62]
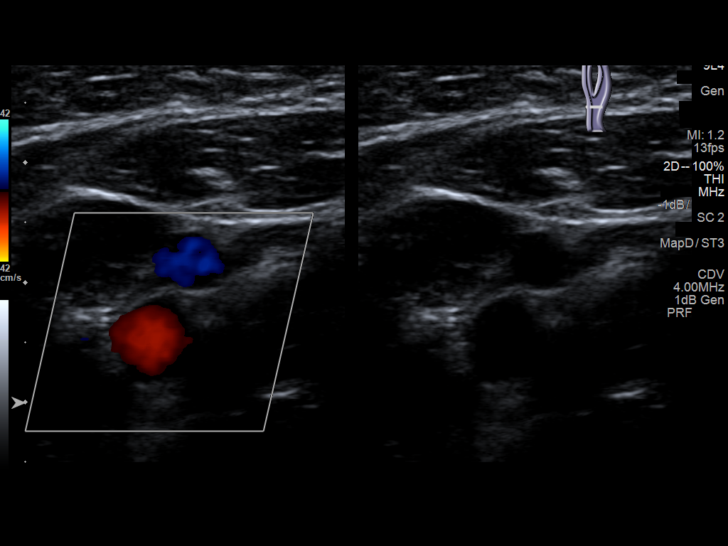
[im 46/62]
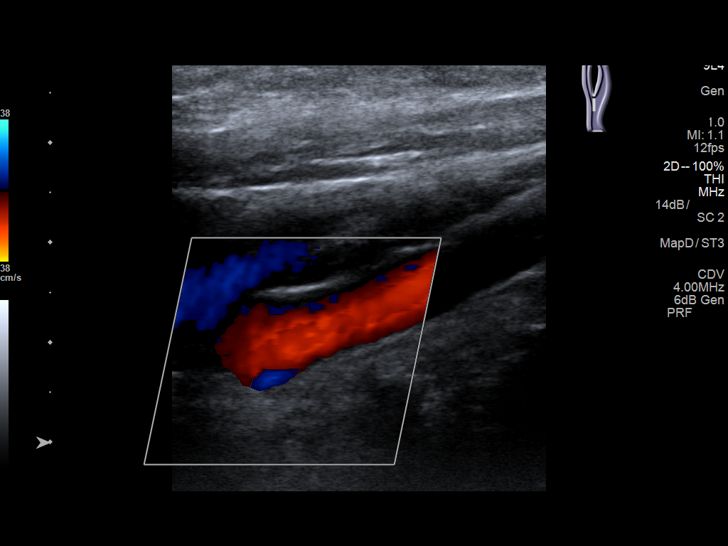
[im 51/62]
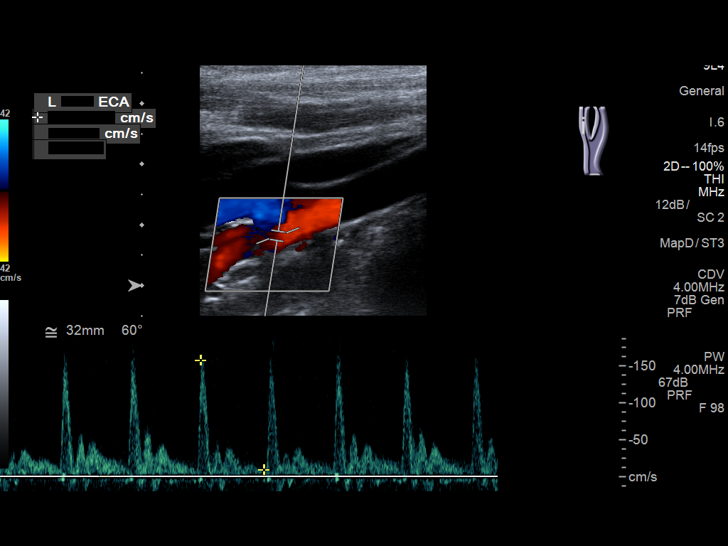
[im 56/62]
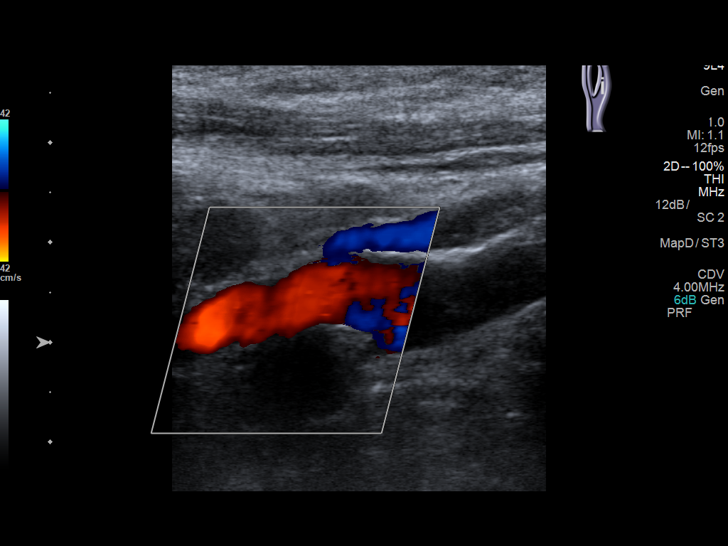
[im 62/62]
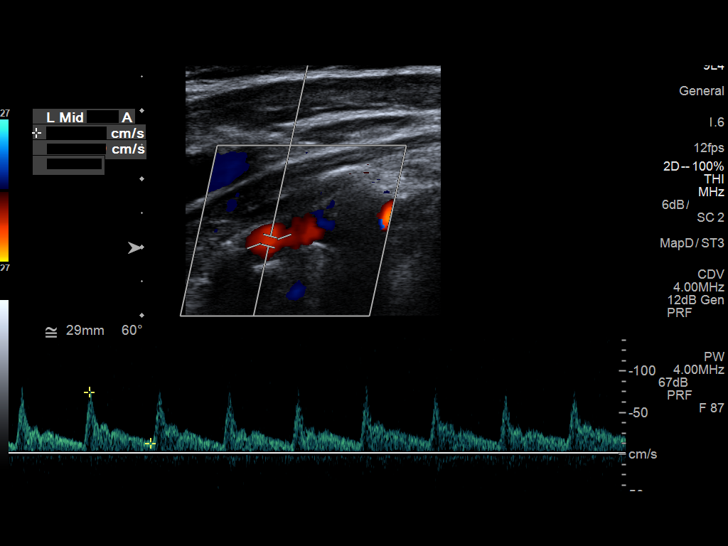

[13 of 24 positions shown; findings below may reference images not displayed]

FINDINGS: Criteria: Quantification of carotid stenosis is based on velocity
parameters that correlate the residual internal carotid diameter
with NASCET-based stenosis levels, using the diameter of the distal
internal carotid lumen as the denominator for stenosis measurement.

The following velocity measurements were obtained:

RIGHT

ICA:  Systolic 96 cm/sec, Diastolic 24 cm/sec

CCA:  127 cm/sec

SYSTOLIC ICA/CCA RATIO:

ECA:  101 cm/sec

LEFT

ICA:  Systolic 89 cm/sec, Diastolic 25 cm/sec

CCA:  140 a cm/sec

SYSTOLIC ICA/CCA RATIO:

ECA:  150 cm/sec

Right Brachial SBP: Not acquired

Left Brachial SBP: Not acquired

RIGHT CAROTID ARTERY: Unremarkable appearance of the carotid system
without significant atherosclerotic disease. Intermediate waveform
of the CCA. Low resistance waveform of the ICA. No significant
tortuosity of the carotid system.

RIGHT VERTEBRAL ARTERY: Antegrade flow with low resistance waveform.

LEFT CAROTID ARTERY: Unremarkable appearance of the carotid system
without significant atherosclerotic disease. Intermediate waveform
of the CCA. Low resistance waveform of the ICA. No significant
tortuosity of the carotid system.

LEFT VERTEBRAL ARTERY:  Antegrade flow with low resistance waveform.
IMPRESSION: Color duplex indicates no significant plaque, with no
hemodynamically significant stenosis by duplex criteria in the
extracranial cerebrovascular circulation.
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
## 2017-09-04 ENCOUNTER — Ambulatory Visit (INDEPENDENT_AMBULATORY_CARE_PROVIDER_SITE_OTHER): Payer: BLUE CROSS/BLUE SHIELD | Admitting: Internal Medicine

## 2017-09-04 ENCOUNTER — Encounter: Payer: Self-pay | Admitting: Internal Medicine

## 2017-09-04 VITALS — BP 122/68 | HR 59 | Ht 69.0 in | Wt 194.0 lb

## 2017-09-04 DIAGNOSIS — F331 Major depressive disorder, recurrent, moderate: Secondary | ICD-10-CM | POA: Insufficient documentation

## 2017-09-04 HISTORY — DX: Major depressive disorder, recurrent, moderate: F33.1

## 2017-09-04 MED ORDER — BUPROPION HCL ER (XL) 150 MG PO TB24: 150.0000 mg | ORAL_TABLET | Freq: Every day | ORAL | 0 refills | Status: DC
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
## 2017-10-05 ENCOUNTER — Other Ambulatory Visit: Payer: Self-pay | Admitting: Internal Medicine

## 2017-10-05 ENCOUNTER — Telehealth: Payer: Self-pay

## 2017-10-05 DIAGNOSIS — F331 Major depressive disorder, recurrent, moderate: Secondary | ICD-10-CM

## 2017-10-05 MED ORDER — BUPROPION HCL ER (XL) 300 MG PO TB24: 300.0000 mg | ORAL_TABLET | Freq: Every day | ORAL | 3 refills | Status: DC

## 2017-10-05 NOTE — Telephone Encounter (Signed)
Patient called stated she started on bupropion a month ago. Was told to call when meds are about to be out and tell if need to increase. Patient stated she thinks its helping but still needs increase in medication. Would like refill sent in to [facility] in [place]. Please Advise.

## 2017-10-15 ENCOUNTER — Telehealth: Payer: Self-pay

## 2017-10-15 NOTE — Telephone Encounter (Signed)
Please call and schedule an appointment, any provider will do since holidays are here!

## 2017-10-15 NOTE — Telephone Encounter (Signed)
Pt had IUD inserted in July, has had period now for two weeks, bloating, and abdominal discomfort.  [phone].

## 2017-10-16 ENCOUNTER — Ambulatory Visit: Payer: BLUE CROSS/BLUE SHIELD | Admitting: Maternal Newborn

## 2017-10-16 ENCOUNTER — Encounter: Payer: Self-pay | Admitting: Maternal Newborn

## 2017-10-16 VITALS — BP 120/80 | HR 60 | Ht 69.0 in | Wt 196.0 lb

## 2017-10-16 DIAGNOSIS — Z30011 Encounter for initial prescription of contraceptive pills: Secondary | ICD-10-CM

## 2017-10-16 DIAGNOSIS — Z30432 Encounter for removal of intrauterine contraceptive device: Secondary | ICD-10-CM

## 2017-10-16 MED ORDER — NORGESTIMATE-ETH ESTRADIOL 0.25-35 MG-MCG PO TABS: 1.0000 | ORAL_TABLET | Freq: Every day | ORAL | 11 refills | Status: DC

## 2017-10-16 NOTE — Progress Notes (Signed)
  GYNECOLOGY OFFICE PROCEDURE NOTE  Erika Barnes is a 26 y.o. [phone] here for ParaGard IUD removal placed 06/22/2017. She desires removal secondary to heavy bleeding, abdominal pain, bloating, mood changes, and irregular periods.  IUD Removal  Patient identified, informed consent performed, consent signed.  Patient was in the dorsal lithotomy position, normal external genitalia was noted.  A speculum was placed in the patient's vagina, normal discharge was noted, no lesions. The cervix was visualized, no lesions, no abnormal discharge.  The strings of the IUD were grasped and pulled using ring forceps. The IUD was removed in its entirety. Patient tolerated the procedure well.    Patient will use combination oral contraceptives for contraception. Routine preventative health maintenance measures emphasized.   Avel Sensor, CNM [facility], [facility]
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
## 2017-12-18 IMAGING — US US OB COMP +14 WK
1 series · 13 of 28 positions shown · non-contrast
Comparison: none

CLINICAL DATA: Antenatal screening

EXAM:
OBSTETRICAL ULTRASOUND >14 WKS

[Series 1: us ob comp +14 wk · 0.25mm/px · 13 of 84 slices shown]
[im 4/84]
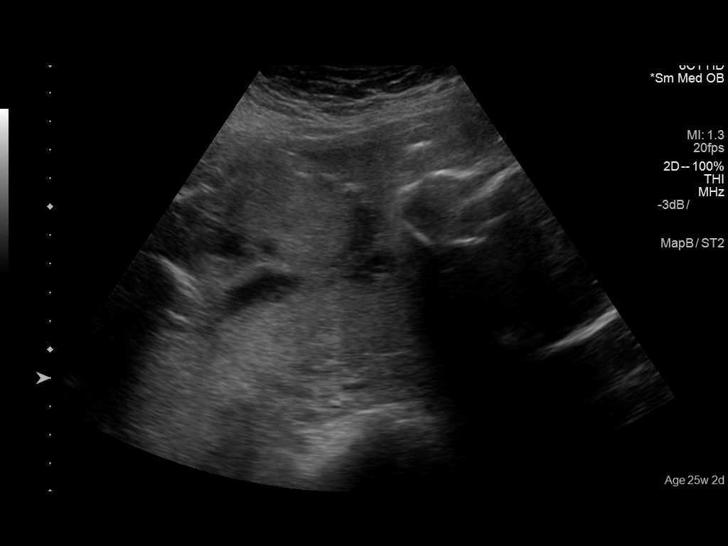
[im 10/84]
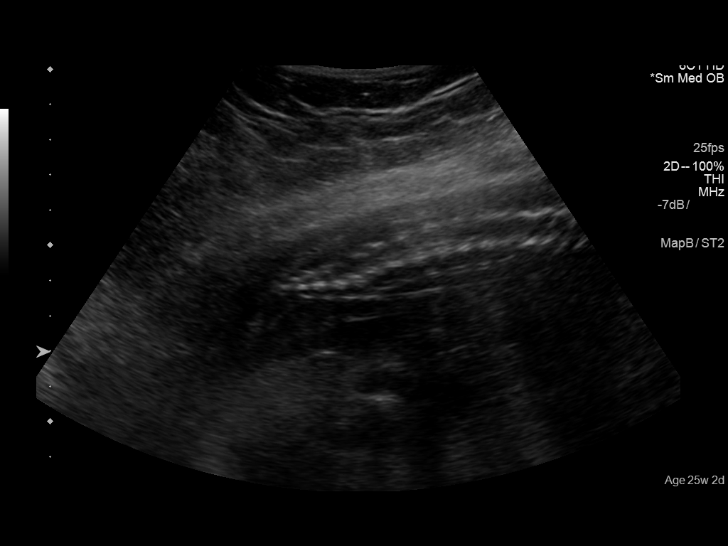
[im 16/84]
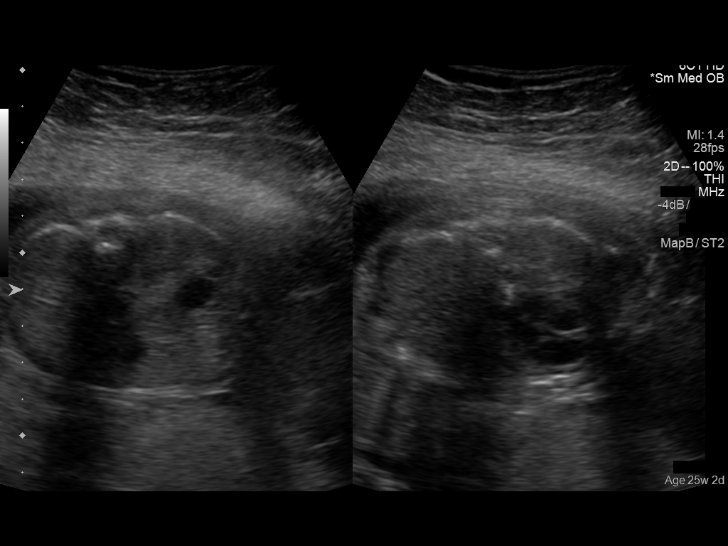
[im 22/84]
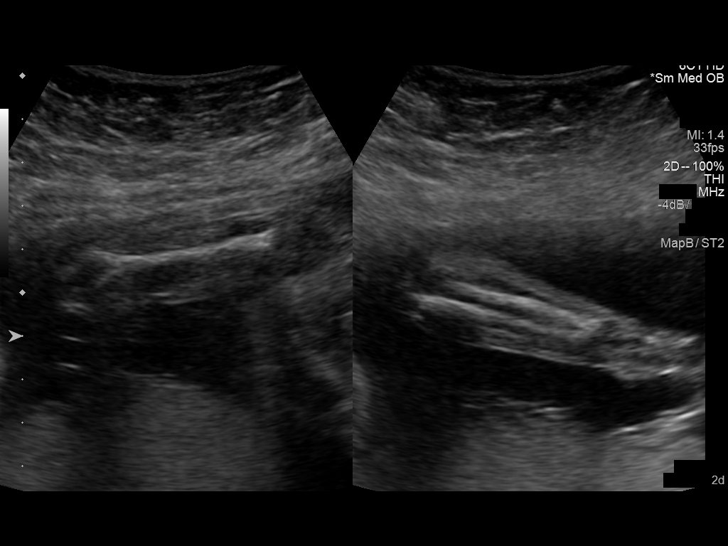
[im 28/84]
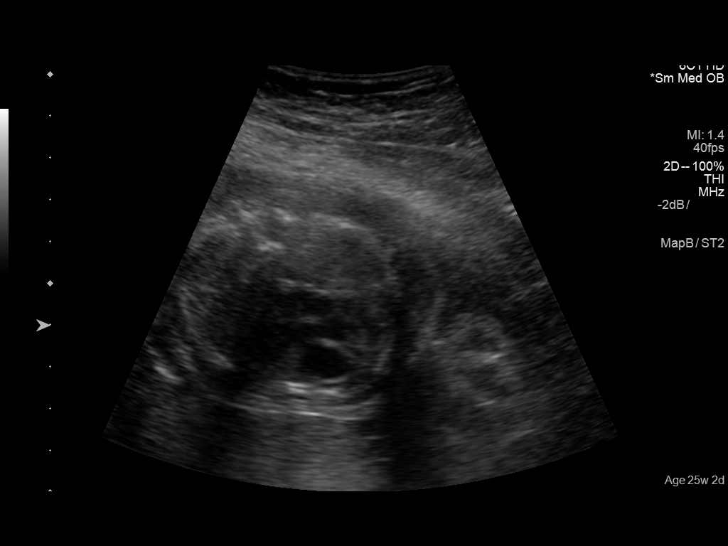
[im 34/84]
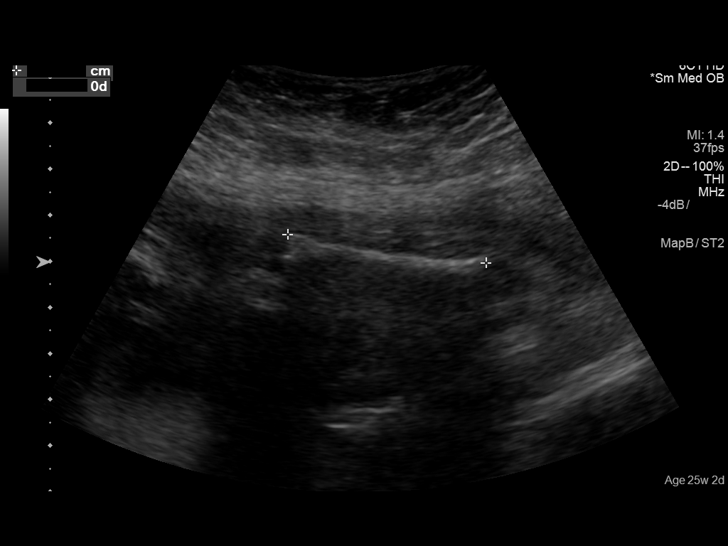
[im 44/84]
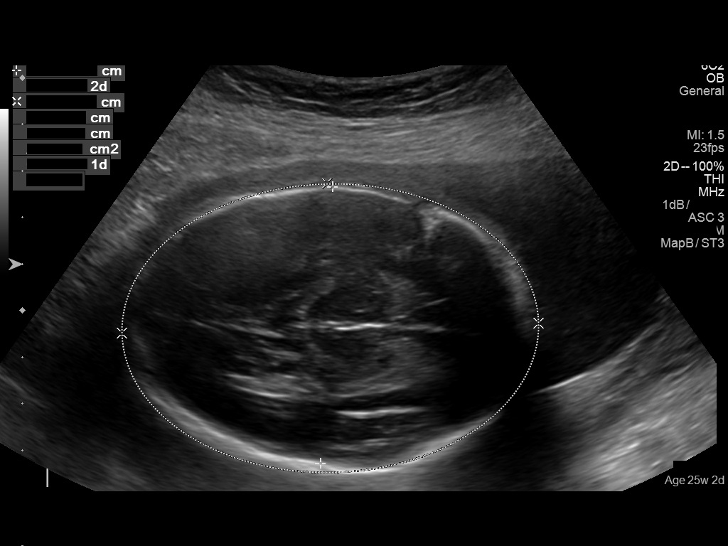
[im 50/84]
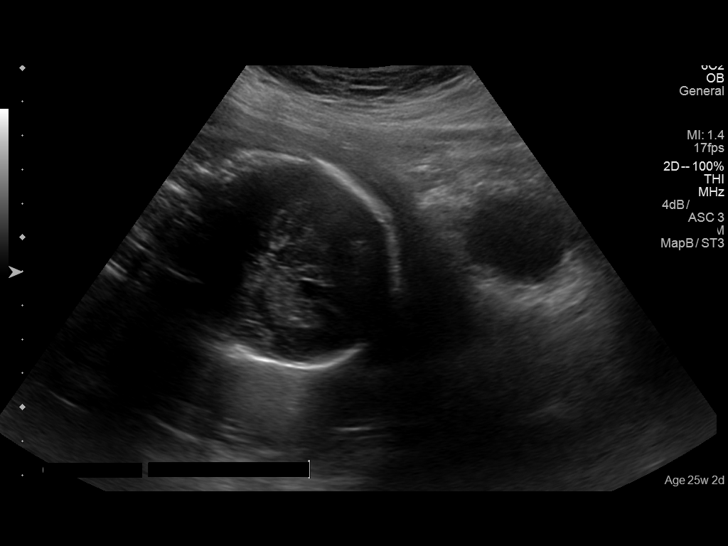
[im 56/84]
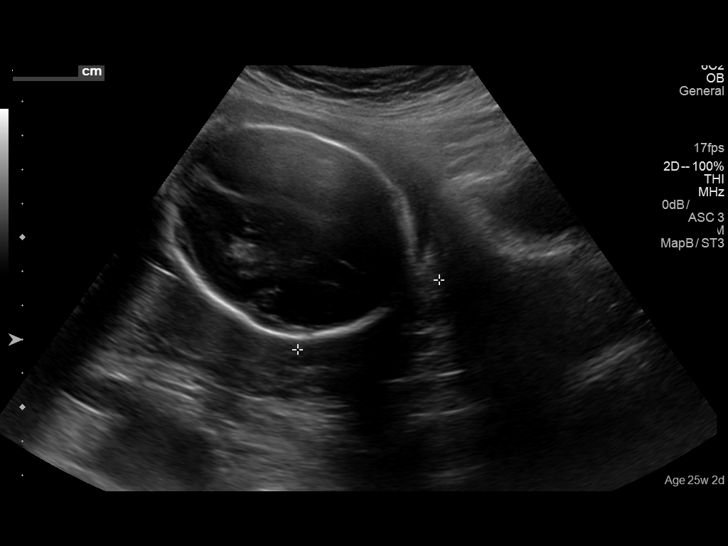
[im 62/84]
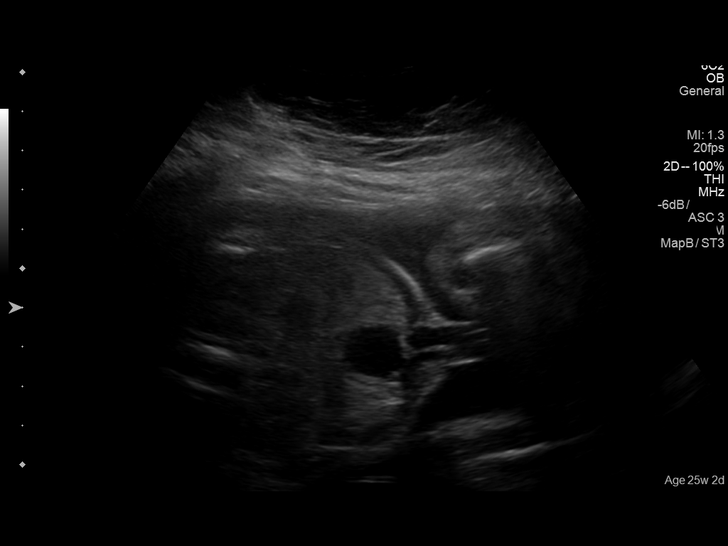
[im 68/84]
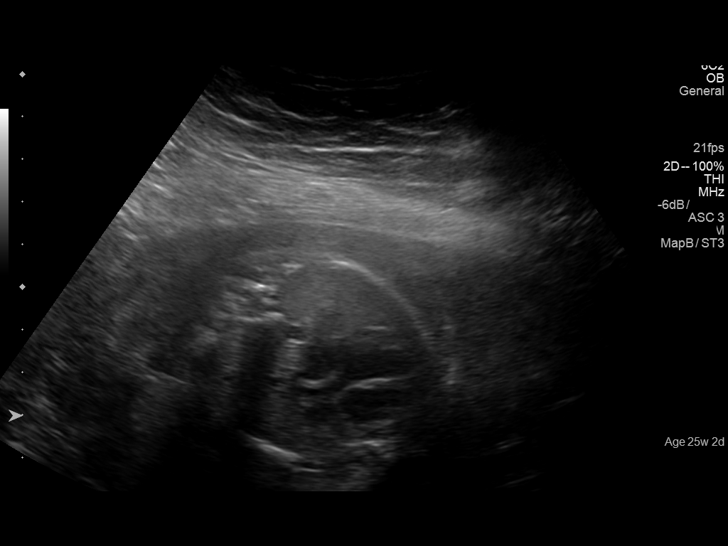
[im 74/84]
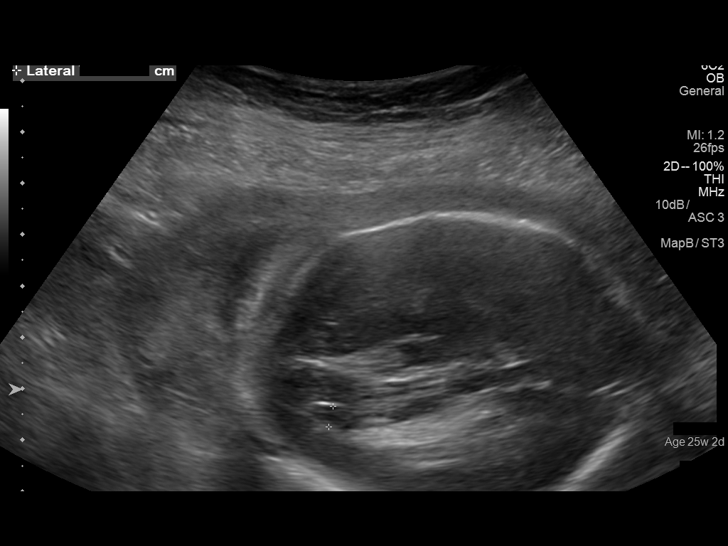
[im 80/84]
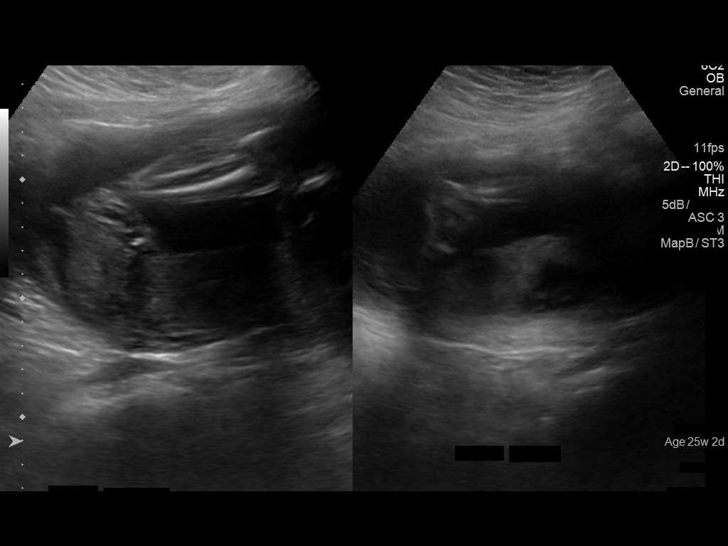

[13 of 28 positions shown; findings below may reference images not displayed]

FINDINGS: Number of Fetuses: 1

Heart Rate:  Single bpm

Movement: Present

Presentation: Cephalic

Previa: None

Placental Location: Posterior and partially fundal. The distance
from the lower placental margin to the internal cervical os is
cm.

Amniotic Fluid (Subjective): Normal

Amniotic Fluid (Objective):  Normal

Vertical pocket 6.1 cmcm

FETAL BIOMETRY

BPD:  5.94cm 24w 2d

HC:    24cm  26w   0d

AC:   21.1cm  25w   4d

FL:   4.95cm  26w   5d

Current Mean GA: 25w 5d              US EDC: April 27, 2017

FETAL ANATOMY

Lateral Ventricles: Visualized

Thalami/CSP: Visualized

Posterior Fossa:  Visualized

Nuchal Region: Appears normal.

Upper Lip: Visualized

Spine: Visualized

4 Chamber Heart on Left: Visualized

LVOT: Visualized

RVOT: Visualized

Stomach on Left: Visualized

3 Vessel Cord: Visualized

Cord Insertion site: Visualized

Kidneys: Visualized

Bladder: Visualized

Extremities: Visualized

Maternal Findings:

Cervix: 4.2 cm in length and closed as well as can be determined.
The fetal head limited visualization of the cervix.
IMPRESSION: There is a single viable IUP with estimated gestational age of 25
weeks 5 days and estimated date of confinement April 27, 2017. The
presentation is cephalic. The placenta is posterior and partially
fundal with no evidence of previa. The amniotic fluid volume is
estimated to be normal. No fetal anomalies are observed.

## 2018-01-21 ENCOUNTER — Ambulatory Visit
Admission: EM | Admit: 2018-01-21 | Discharge: 2018-01-21 | Disposition: A | Discharge status: Home / Self Care | Payer: Managed Care, Other (non HMO) | Attending: Family Medicine | Admitting: Family Medicine

## 2018-01-21 ENCOUNTER — Encounter: Payer: Self-pay | Admitting: Emergency Medicine

## 2018-01-21 ENCOUNTER — Other Ambulatory Visit: Payer: Self-pay

## 2018-01-21 DIAGNOSIS — R69 Illness, unspecified: Secondary | ICD-10-CM | POA: Diagnosis not present

## 2018-01-21 DIAGNOSIS — J111 Influenza due to unidentified influenza virus with other respiratory manifestations: Secondary | ICD-10-CM

## 2018-01-21 DIAGNOSIS — H1089 Other conjunctivitis: Secondary | ICD-10-CM

## 2018-01-21 DIAGNOSIS — R51 Headache: Secondary | ICD-10-CM | POA: Diagnosis not present

## 2018-01-21 DIAGNOSIS — R509 Fever, unspecified: Secondary | ICD-10-CM

## 2018-01-21 DIAGNOSIS — M791 Myalgia, unspecified site: Secondary | ICD-10-CM | POA: Diagnosis not present

## 2018-01-21 DIAGNOSIS — R5383 Other fatigue: Secondary | ICD-10-CM | POA: Diagnosis not present

## 2018-01-21 DIAGNOSIS — H1031 Unspecified acute conjunctivitis, right eye: Secondary | ICD-10-CM

## 2018-01-21 LAB — RAPID INFLUENZA A&B ANTIGENS (ARMC ONLY): INFLUENZA B (ARMC): NEGATIVE

## 2018-01-21 LAB — RAPID INFLUENZA A&B ANTIGENS: Influenza A (ARMC): NEGATIVE

## 2018-01-21 MED ORDER — OSELTAMIVIR PHOSPHATE 75 MG PO CAPS: 75.0000 mg | ORAL_CAPSULE | Freq: Two times a day (BID) | ORAL | 0 refills | Status: DC

## 2018-01-21 MED ORDER — MOXIFLOXACIN HCL 0.5 % OP SOLN: 1.0000 [drp] | Freq: Three times a day (TID) | OPHTHALMIC | 0 refills | Status: DC
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
## 2018-01-21 NOTE — ED Triage Notes (Signed)
Patient states her daughter was just diagnosed with pink eye this morning. Patient states patients eyes became red yesterday.  This morning she woke with body aches and fever.

## 2018-01-23 ENCOUNTER — Ambulatory Visit
Admission: EM | Admit: 2018-01-23 | Discharge: 2018-01-23 | Disposition: A | Discharge status: Home / Self Care | Payer: Managed Care, Other (non HMO) | Attending: Family Medicine | Admitting: Family Medicine

## 2018-01-23 ENCOUNTER — Encounter: Payer: Self-pay | Admitting: Emergency Medicine

## 2018-01-23 ENCOUNTER — Other Ambulatory Visit: Payer: Self-pay

## 2018-01-23 ENCOUNTER — Ambulatory Visit (INDEPENDENT_AMBULATORY_CARE_PROVIDER_SITE_OTHER): Payer: Managed Care, Other (non HMO)

## 2018-01-23 DIAGNOSIS — R0602 Shortness of breath: Secondary | ICD-10-CM

## 2018-01-23 DIAGNOSIS — R69 Illness, unspecified: Secondary | ICD-10-CM

## 2018-01-23 DIAGNOSIS — Z8709 Personal history of other diseases of the respiratory system: Secondary | ICD-10-CM

## 2018-01-23 DIAGNOSIS — J111 Influenza due to unidentified influenza virus with other respiratory manifestations: Secondary | ICD-10-CM

## 2018-01-23 MED ORDER — PREDNISONE 20 MG PO TABS: 20.0000 mg | ORAL_TABLET | Freq: Every day | ORAL | 0 refills | Status: DC
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
## 2018-01-23 NOTE — ED Triage Notes (Addendum)
Patient in today stating that she was diagnosed with the flu 2 days ago. She states that she is having more sob that her inhaler is not helping. Patient is taking Tamiflu.

## 2018-01-23 NOTE — Discharge Instructions (Signed)
Rest, fluids. 

## 2018-03-03 ENCOUNTER — Other Ambulatory Visit: Payer: Self-pay | Admitting: Internal Medicine

## 2018-03-03 DIAGNOSIS — F331 Major depressive disorder, recurrent, moderate: Secondary | ICD-10-CM

## 2018-03-12 ENCOUNTER — Other Ambulatory Visit: Payer: Self-pay

## 2018-03-15 ENCOUNTER — Ambulatory Visit: Payer: BLUE CROSS/BLUE SHIELD | Admitting: Internal Medicine

## 2018-03-19 ENCOUNTER — Ambulatory Visit (INDEPENDENT_AMBULATORY_CARE_PROVIDER_SITE_OTHER): Payer: Managed Care, Other (non HMO) | Admitting: Internal Medicine

## 2018-03-19 ENCOUNTER — Ambulatory Visit: Payer: Self-pay | Admitting: Internal Medicine

## 2018-03-19 ENCOUNTER — Encounter: Payer: Self-pay | Admitting: Internal Medicine

## 2018-03-19 VITALS — BP 122/78 | HR 78 | Ht 69.0 in | Wt 195.0 lb

## 2018-03-19 DIAGNOSIS — F331 Major depressive disorder, recurrent, moderate: Secondary | ICD-10-CM | POA: Diagnosis not present

## 2018-03-19 DIAGNOSIS — K5901 Slow transit constipation: Secondary | ICD-10-CM

## 2018-03-19 DIAGNOSIS — J3089 Other allergic rhinitis: Secondary | ICD-10-CM | POA: Diagnosis not present

## 2018-03-19 DIAGNOSIS — M67432 Ganglion, left wrist: Secondary | ICD-10-CM | POA: Diagnosis not present

## 2018-03-19 MED ORDER — ALBUTEROL SULFATE HFA 108 (90 BASE) MCG/ACT IN AERS: 1.0000 | INHALATION_SPRAY | Freq: Four times a day (QID) | RESPIRATORY_TRACT | 2 refills | Status: DC | PRN
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
## 2018-03-22 ENCOUNTER — Other Ambulatory Visit: Payer: Self-pay | Admitting: Maternal Newborn

## 2018-03-22 DIAGNOSIS — Z30011 Encounter for initial prescription of contraceptive pills: Secondary | ICD-10-CM

## 2018-03-22 MED ORDER — NORGESTIMATE-ETH ESTRADIOL 0.25-35 MG-MCG PO TABS: 1.0000 | ORAL_TABLET | Freq: Every day | ORAL | 2 refills | Status: DC

## 2018-03-26 ENCOUNTER — Other Ambulatory Visit: Payer: Self-pay | Admitting: Internal Medicine

## 2018-03-26 DIAGNOSIS — F331 Major depressive disorder, recurrent, moderate: Secondary | ICD-10-CM

## 2018-03-26 MED ORDER — BUPROPION HCL ER (XL) 300 MG PO TB24: 300.0000 mg | ORAL_TABLET | Freq: Every day | ORAL | 3 refills | Status: DC

## 2018-04-12 ENCOUNTER — Encounter: Payer: Self-pay | Admitting: Emergency Medicine

## 2018-04-12 ENCOUNTER — Other Ambulatory Visit: Payer: Self-pay

## 2018-04-12 ENCOUNTER — Ambulatory Visit
Admission: EM | Admit: 2018-04-12 | Discharge: 2018-04-12 | Disposition: A | Discharge status: Home / Self Care | Payer: Managed Care, Other (non HMO) | Attending: Family Medicine | Admitting: Family Medicine

## 2018-04-12 DIAGNOSIS — R197 Diarrhea, unspecified: Secondary | ICD-10-CM | POA: Diagnosis not present

## 2018-04-12 DIAGNOSIS — R42 Dizziness and giddiness: Secondary | ICD-10-CM | POA: Diagnosis not present

## 2018-04-12 DIAGNOSIS — R350 Frequency of micturition: Secondary | ICD-10-CM

## 2018-04-12 LAB — URINALYSIS, COMPLETE (UACMP) WITH MICROSCOPIC
GLUCOSE, UA: NEGATIVE mg/dL
LEUKOCYTES UA: NEGATIVE
Nitrite: NEGATIVE
PH: 5.5 (ref 5.0–8.0)
Protein, ur: 30 mg/dL — AB

## 2018-04-12 NOTE — ED Triage Notes (Signed)
Patient states she is having frequent urination and diarrhea and some dizziness

## 2018-04-12 NOTE — Discharge Instructions (Signed)
We will call with the culture results.  Take care  Dr. Lacinda Axon
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
## 2018-04-13 LAB — URINE CULTURE

## 2018-04-15 ENCOUNTER — Telehealth (HOSPITAL_COMMUNITY): Payer: Self-pay

## 2018-04-15 NOTE — Telephone Encounter (Signed)
Urine culture does not clearly demonstrate a UTI.  Pt contacted and made aware. Educated on other possible causes of urinary discomfort include chafing; irritation from hygiene product; other pelvic infection (yeast, bacterial vaginosis) or STD; occasional dietary cause (caffeine); bowel issue (constipation); low estrogen effect; kidney stone passage; or interstitial cystitis.  Recheck or followup with your primary care provider for further evaluation if symptoms are not improving. Pt verbalized understanding.

## 2018-06-05 IMAGING — CR DG CHEST 2V
2 series · 2 of 2 positions shown · non-contrast
Comparison: 08/06/2013

CLINICAL DATA: Shortness of breath.

EXAM:
CHEST  2 VIEW

[chest pa]
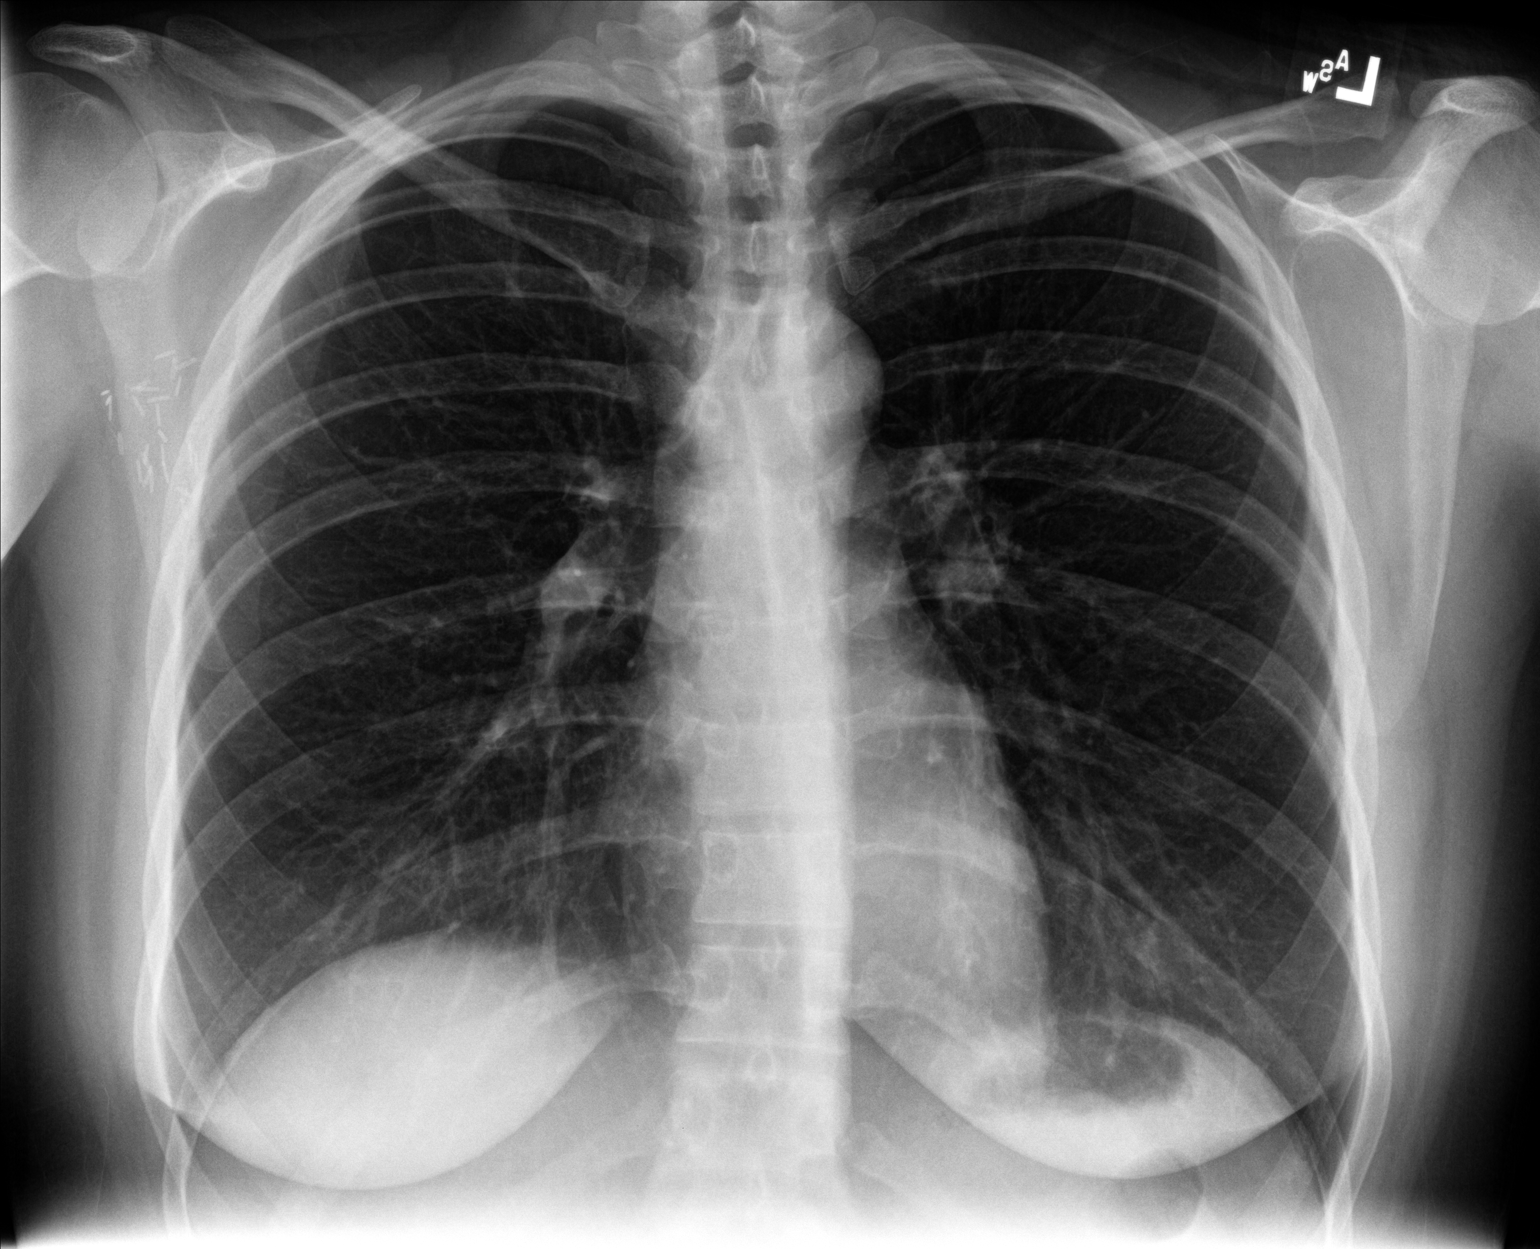

[chest lat]
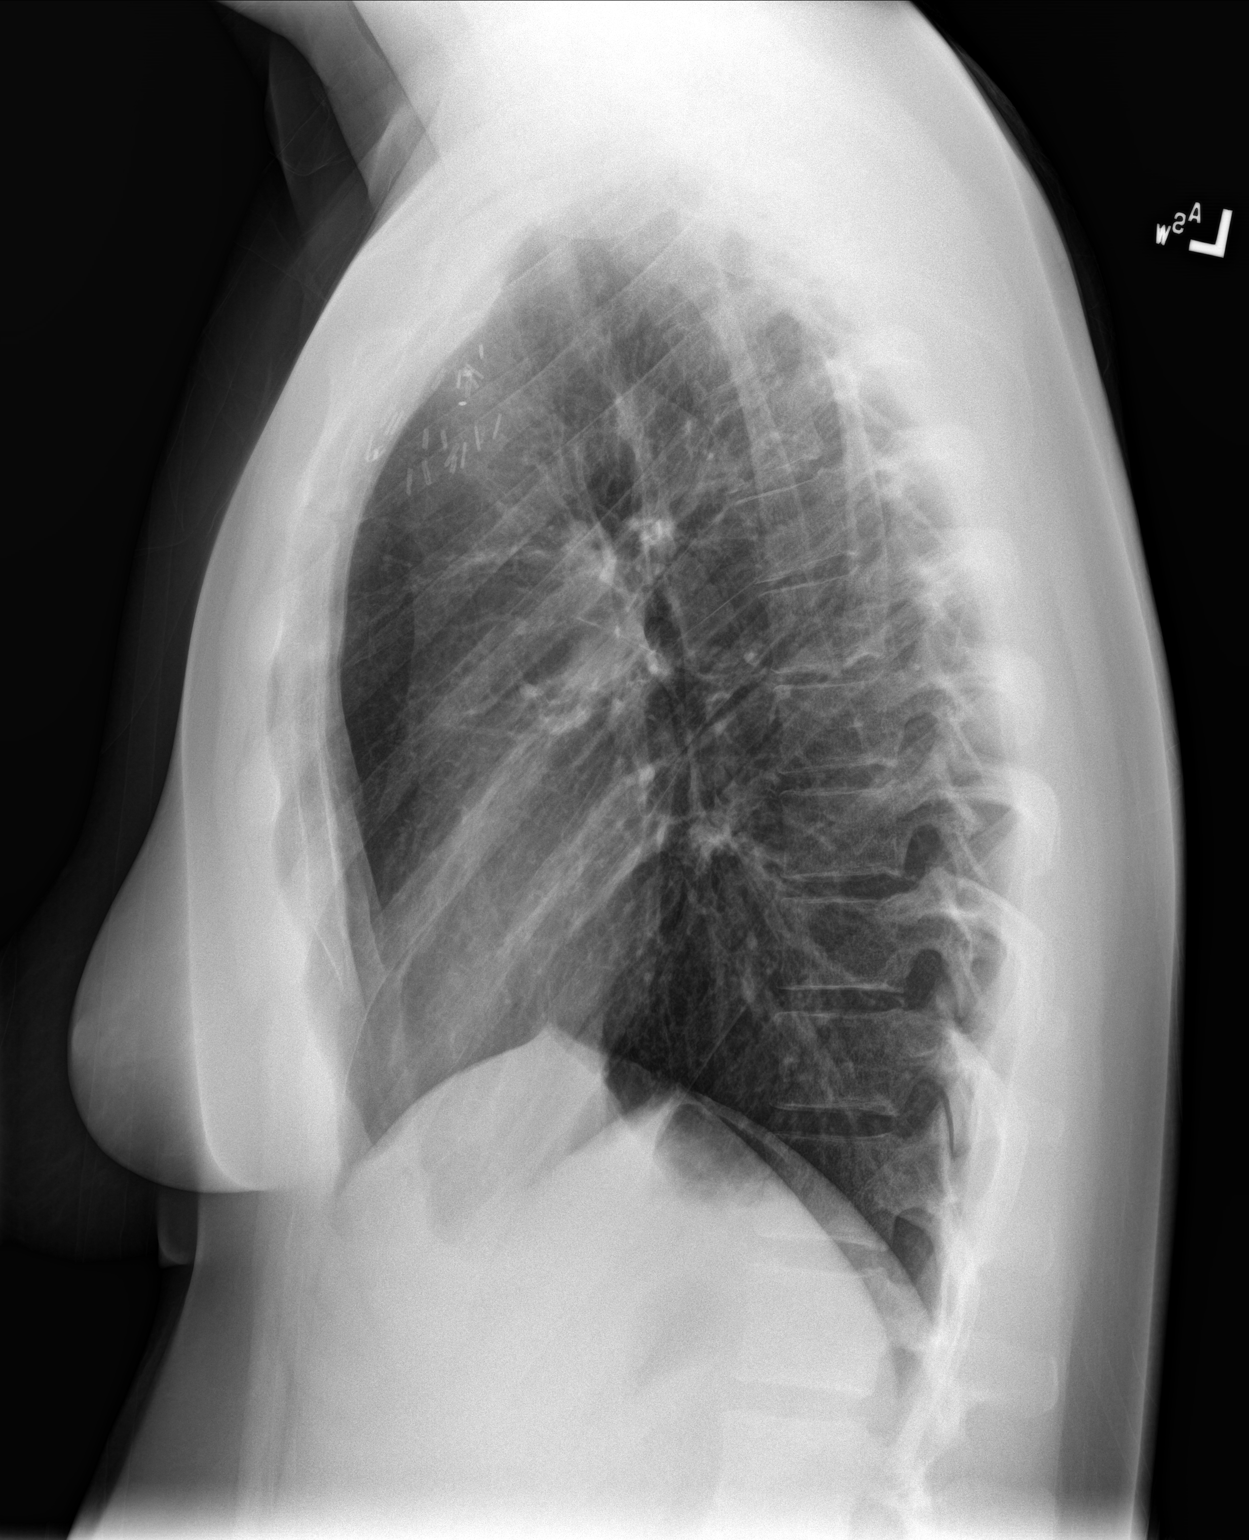

[2 of 2 positions shown; findings below may reference images not displayed]

FINDINGS: The heart size and mediastinal contours are within normal limits.
Both lungs are clear. The visualized skeletal structures are
unremarkable.
IMPRESSION: No active cardiopulmonary disease.

## 2018-07-24 ENCOUNTER — Ambulatory Visit: Payer: BLUE CROSS/BLUE SHIELD | Admitting: Maternal Newborn

## 2018-08-12 ENCOUNTER — Encounter: Payer: Self-pay | Admitting: Internal Medicine

## 2018-08-12 ENCOUNTER — Ambulatory Visit: Payer: Managed Care, Other (non HMO) | Admitting: Internal Medicine

## 2018-08-12 VITALS — BP 124/78 | HR 93 | Ht 69.0 in | Wt 193.0 lb

## 2018-08-12 DIAGNOSIS — Z23 Encounter for immunization: Secondary | ICD-10-CM

## 2018-08-12 DIAGNOSIS — F3281 Premenstrual dysphoric disorder: Secondary | ICD-10-CM

## 2018-08-12 MED ORDER — FLUOXETINE HCL 20 MG PO TABS: 20.0000 mg | ORAL_TABLET | Freq: Every day | ORAL | 3 refills | Status: DC
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
## 2018-08-29 ENCOUNTER — Other Ambulatory Visit: Payer: Self-pay

## 2018-08-29 ENCOUNTER — Encounter: Payer: Self-pay | Admitting: Emergency Medicine

## 2018-08-29 ENCOUNTER — Ambulatory Visit
Admission: EM | Admit: 2018-08-29 | Discharge: 2018-08-29 | Disposition: A | Discharge status: Home / Self Care | Payer: Managed Care, Other (non HMO) | Attending: Emergency Medicine | Admitting: Emergency Medicine

## 2018-08-29 DIAGNOSIS — J4521 Mild intermittent asthma with (acute) exacerbation: Secondary | ICD-10-CM

## 2018-08-29 DIAGNOSIS — J0140 Acute pansinusitis, unspecified: Secondary | ICD-10-CM

## 2018-08-29 DIAGNOSIS — J302 Other seasonal allergic rhinitis: Secondary | ICD-10-CM

## 2018-08-29 DIAGNOSIS — J3089 Other allergic rhinitis: Secondary | ICD-10-CM

## 2018-08-29 MED ORDER — DOXYCYCLINE HYCLATE 100 MG PO CAPS: 100.0000 mg | ORAL_CAPSULE | Freq: Two times a day (BID) | ORAL | 0 refills | Status: AC

## 2018-08-29 MED ORDER — FLUTICASONE PROPIONATE 50 MCG/ACT NA SUSP: 2.0000 | Freq: Every day | NASAL | 0 refills | Status: AC

## 2018-08-29 MED ORDER — ALBUTEROL SULFATE HFA 108 (90 BASE) MCG/ACT IN AERS: 1.0000 | INHALATION_SPRAY | RESPIRATORY_TRACT | 0 refills | Status: DC | PRN

## 2018-08-29 MED ORDER — PREDNISONE 50 MG PO TABS: 50.0000 mg | ORAL_TABLET | Freq: Every day | ORAL | 0 refills | Status: DC

## 2018-08-29 MED ORDER — AEROCHAMBER PLUS MISC: 2 refills | Status: DC
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
## 2018-08-29 NOTE — ED Triage Notes (Signed)
Patient c/o sinus congestion and pressure, nasal congestion and HAs that started 4 days ago.  Patient has not checked her temperature.

## 2018-08-29 NOTE — Discharge Instructions (Addendum)
Continue Flonase, saline nasal irrigation with a Erika Barnes med rinse and distilled water as often as you want, 2 puffs from your albuterol inhaler with a spacer every 4-6 hours as needed for coughing, wheezing, shortness of breath.  Start an antihistamine/decongestant combination such as Claritin-D, Allegra-D, Zyrtec-D.  Prednisone 50 mg for 5 days for your asthma, and a wait-and-see prescription of doxycycline.  You can also fill it today if you do not want to wait.  Follow-up with PMD as needed.

## 2018-11-05 ENCOUNTER — Ambulatory Visit (INDEPENDENT_AMBULATORY_CARE_PROVIDER_SITE_OTHER): Payer: Managed Care, Other (non HMO) | Admitting: Internal Medicine

## 2018-11-05 ENCOUNTER — Encounter: Payer: Self-pay | Admitting: Internal Medicine

## 2018-11-05 VITALS — BP 115/70 | HR 96 | Ht 69.0 in | Wt 173.0 lb

## 2018-11-05 DIAGNOSIS — G43009 Migraine without aura, not intractable, without status migrainosus: Secondary | ICD-10-CM | POA: Diagnosis not present

## 2018-11-05 DIAGNOSIS — Z0000 Encounter for general adult medical examination without abnormal findings: Secondary | ICD-10-CM

## 2018-11-05 DIAGNOSIS — J3089 Other allergic rhinitis: Secondary | ICD-10-CM

## 2018-11-05 LAB — POCT URINALYSIS DIPSTICK
BILIRUBIN UA: NEGATIVE
GLUCOSE UA: NEGATIVE
KETONES UA: NEGATIVE
Leukocytes, UA: NEGATIVE
Nitrite, UA: NEGATIVE
Protein, UA: NEGATIVE
SPEC GRAV UA: 1.02 (ref 1.010–1.025)
Urobilinogen, UA: 0.2 E.U./dL
pH, UA: 6 (ref 5.0–8.0)
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
## 2018-11-05 NOTE — Patient Instructions (Signed)

## 2018-11-06 LAB — COMPREHENSIVE METABOLIC PANEL
A/G RATIO: 1.5 (ref 1.2–2.2)
ALBUMIN: 4.1 g/dL (ref 3.5–5.5)
ALT: 11 IU/L (ref 0–32)
AST: 14 IU/L (ref 0–40)
Alkaline Phosphatase: 80 IU/L (ref 39–117)
BUN/Creatinine Ratio: 15 (ref 9–23)
BUN: 11 mg/dL (ref 6–20)
Bilirubin Total: <0.2 mg/dL (ref 0.0–1.2)
CO2: 20 mmol/L (ref 20–29)
Calcium: 9.4 mg/dL (ref 8.7–10.2)
Chloride: 104 mmol/L (ref 96–106)
Creatinine, Ser: 0.72 mg/dL (ref 0.57–1.00)
GFR calc Af Amer: 134 mL/min/{1.73_m2} (ref >59)
GFR calc non Af Amer: 116 mL/min/{1.73_m2} (ref >59)
GLOBULIN, TOTAL: 2.7 g/dL (ref 1.5–4.5)
GLUCOSE: 87 mg/dL (ref 65–99)
POTASSIUM: 4.9 mmol/L (ref 3.5–5.2)
Sodium: 140 mmol/L (ref 134–144)
TOTAL PROTEIN: 6.8 g/dL (ref 6.0–8.5)

## 2018-11-06 LAB — CBC WITH DIFFERENTIAL/PLATELET
BASOS: 1 %
Basophils Absolute: 0.1 10*3/uL (ref 0.0–0.2)
EOS (ABSOLUTE): 0.5 10*3/uL — AB (ref 0.0–0.4)
EOS: 5 %
HEMATOCRIT: 41.9 % (ref 34.0–46.6)
Hemoglobin: 14.7 g/dL (ref 11.1–15.9)
Immature Grans (Abs): 0 10*3/uL (ref 0.0–0.1)
Immature Granulocytes: 0 %
LYMPHS: 35 %
Lymphocytes Absolute: 3.9 10*3/uL — ABNORMAL HIGH (ref 0.7–3.1)
MCH: 28.8 pg (ref 26.6–33.0)
MCHC: 35.1 g/dL (ref 31.5–35.7)
MCV: 82 fL (ref 79–97)
Monocytes Absolute: 0.8 10*3/uL (ref 0.1–0.9)
Monocytes: 7 %
NEUTROS ABS: 5.9 10*3/uL (ref 1.4–7.0)
NEUTROS PCT: 52 %
PLATELETS: 431 10*3/uL (ref 150–450)
RBC: 5.11 x10E6/uL (ref 3.77–5.28)
RDW: 13.7 % (ref 12.3–15.4)
WBC: 11.3 10*3/uL — ABNORMAL HIGH (ref 3.4–10.8)

## 2018-11-06 LAB — LIPID PANEL
CHOL/HDL RATIO: 3.6 ratio (ref 0.0–4.4)
CHOLESTEROL TOTAL: 175 mg/dL (ref 100–199)
HDL: 48 mg/dL (ref >39)
LDL Calculated: 85 mg/dL (ref 0–99)
Triglycerides: 211 mg/dL — ABNORMAL HIGH (ref 0–149)
VLDL CHOLESTEROL CAL: 42 mg/dL — AB (ref 5–40)

## 2018-11-06 LAB — TSH: TSH: 1.29 u[IU]/mL (ref 0.450–4.500)

## 2018-11-07 ENCOUNTER — Other Ambulatory Visit: Payer: Self-pay | Admitting: Maternal Newborn

## 2018-11-07 DIAGNOSIS — Z30011 Encounter for initial prescription of contraceptive pills: Secondary | ICD-10-CM

## 2018-11-07 MED ORDER — NORGESTIMATE-ETH ESTRADIOL 0.25-35 MG-MCG PO TABS: 1.0000 | ORAL_TABLET | Freq: Every day | ORAL | 1 refills | Status: DC

## 2019-05-15 ENCOUNTER — Ambulatory Visit (INDEPENDENT_AMBULATORY_CARE_PROVIDER_SITE_OTHER)
Admission: RE | Admit: 2019-05-15 | Discharge: 2019-05-15 | Disposition: A | Discharge status: Home / Self Care | Payer: Managed Care, Other (non HMO) | Source: Ambulatory Visit

## 2019-05-15 DIAGNOSIS — S91301A Unspecified open wound, right foot, initial encounter: Secondary | ICD-10-CM

## 2019-05-15 DIAGNOSIS — L089 Local infection of the skin and subcutaneous tissue, unspecified: Secondary | ICD-10-CM

## 2019-05-15 MED ORDER — DOXYCYCLINE HYCLATE 100 MG PO CAPS: 100.0000 mg | ORAL_CAPSULE | Freq: Two times a day (BID) | ORAL | 0 refills | Status: AC
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
## 2019-05-15 NOTE — Discharge Instructions (Signed)
Doxycycline twice daily for 10 days Keep clean and dry - wash with warm soapy water twice daily and dry well Monitor for continued gradual improvement Follow up if having increased redness, swelling and drainage

## 2020-01-03 ENCOUNTER — Ambulatory Visit
Admission: EM | Admit: 2020-01-03 | Discharge: 2020-01-03 | Disposition: A | Discharge status: Home / Self Care | Payer: Managed Care, Other (non HMO) | Attending: Internal Medicine | Admitting: Internal Medicine

## 2020-01-03 ENCOUNTER — Other Ambulatory Visit: Payer: Self-pay

## 2020-01-03 ENCOUNTER — Encounter: Payer: Self-pay | Admitting: Emergency Medicine

## 2020-01-03 DIAGNOSIS — E860 Dehydration: Secondary | ICD-10-CM

## 2020-01-03 DIAGNOSIS — R42 Dizziness and giddiness: Secondary | ICD-10-CM

## 2020-01-03 LAB — URINALYSIS, COMPLETE (UACMP) WITH MICROSCOPIC
Bilirubin Urine: NEGATIVE
Glucose, UA: NEGATIVE mg/dL
Ketones, ur: NEGATIVE mg/dL
Leukocytes,Ua: NEGATIVE
Nitrite: NEGATIVE
Protein, ur: NEGATIVE mg/dL
Specific Gravity, Urine: 1.01 (ref 1.005–1.030)
pH: 7 (ref 5.0–8.0)
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
## 2020-01-03 NOTE — Discharge Instructions (Signed)
Try to drink enough water to keep your urine clear or pale yellow clear.  Have somebody with you tonight at all times.  If you do have another dizzy spell or you pass out go immediately to the emergency room.  You do not have a urinary tract infection from your urinalysis today.  Recommend following up with your primary care physician next week.

## 2020-01-03 NOTE — ED Triage Notes (Signed)
Pt states about 1 pm today she started feeling like she was going to pass out and has been intermittent since. She states that this last time this happened she had a UTI. She denies urinary symptoms but states that she did not have any before. Denies palpitations, chest pain, shortness of breath. She also states that she quit smoking "cold [place]" about 4 days ago.

## 2020-08-07 ENCOUNTER — Other Ambulatory Visit: Payer: Self-pay

## 2020-08-07 ENCOUNTER — Ambulatory Visit
Admission: EM | Admit: 2020-08-07 | Discharge: 2020-08-07 | Disposition: A | Discharge status: Home / Self Care | Payer: 59 | Attending: Family | Admitting: Family

## 2020-08-07 DIAGNOSIS — J0110 Acute frontal sinusitis, unspecified: Secondary | ICD-10-CM | POA: Insufficient documentation

## 2020-08-07 DIAGNOSIS — R05 Cough: Secondary | ICD-10-CM | POA: Insufficient documentation

## 2020-08-07 DIAGNOSIS — A630 Anogenital (venereal) warts: Secondary | ICD-10-CM | POA: Diagnosis not present

## 2020-08-07 DIAGNOSIS — J209 Acute bronchitis, unspecified: Secondary | ICD-10-CM | POA: Insufficient documentation

## 2020-08-07 DIAGNOSIS — R5383 Other fatigue: Secondary | ICD-10-CM | POA: Insufficient documentation

## 2020-08-07 DIAGNOSIS — F17210 Nicotine dependence, cigarettes, uncomplicated: Secondary | ICD-10-CM | POA: Insufficient documentation

## 2020-08-07 DIAGNOSIS — Z20822 Contact with and (suspected) exposure to covid-19: Secondary | ICD-10-CM | POA: Diagnosis not present

## 2020-08-07 DIAGNOSIS — K219 Gastro-esophageal reflux disease without esophagitis: Secondary | ICD-10-CM | POA: Insufficient documentation

## 2020-08-07 DIAGNOSIS — R6883 Chills (without fever): Secondary | ICD-10-CM | POA: Insufficient documentation

## 2020-08-07 DIAGNOSIS — F329 Major depressive disorder, single episode, unspecified: Secondary | ICD-10-CM | POA: Diagnosis not present

## 2020-08-07 DIAGNOSIS — R0602 Shortness of breath: Secondary | ICD-10-CM | POA: Insufficient documentation

## 2020-08-07 DIAGNOSIS — Z79899 Other long term (current) drug therapy: Secondary | ICD-10-CM | POA: Diagnosis not present

## 2020-08-07 DIAGNOSIS — Z86718 Personal history of other venous thrombosis and embolism: Secondary | ICD-10-CM | POA: Insufficient documentation

## 2020-08-07 DIAGNOSIS — R0981 Nasal congestion: Secondary | ICD-10-CM | POA: Diagnosis not present

## 2020-08-07 DIAGNOSIS — R059 Cough, unspecified: Secondary | ICD-10-CM

## 2020-08-07 MED ORDER — AMOXICILLIN-POT CLAVULANATE 875-125 MG PO TABS: 1.0000 | ORAL_TABLET | Freq: Two times a day (BID) | ORAL | 0 refills | Status: AC

## 2020-08-07 MED ORDER — PREDNISONE 20 MG PO TABS: 40.0000 mg | ORAL_TABLET | Freq: Every day | ORAL | 0 refills | Status: AC

## 2020-08-07 NOTE — Discharge Instructions (Addendum)
Recommend start Augmentin 875mg  twice a day as directed. May take Prednisone (steroid) 40mg  daily for 5 days then stop. Continue Albuterol inhaler 2 puffs every 4 hours as needed for wheezing/cough. Continue to push fluids to help loosen up mucus in chest. May continue Mucinex as needed. Rest. Stay at home. Follow-up pending COVID 19 test results and in 3 to 4 days if not improving.
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
## 2020-08-07 NOTE — ED Triage Notes (Signed)
Patient complains of cough, congestion, shortness of breath. States that this started around 13 days ago, reports that she started taking Mucinex and Sudafed and improved and symptoms returned around 4 days ago. States that she has had to use her inhaler. Has not had any covid testing and is fully vaccinated.

## 2020-08-08 LAB — SARS CORONAVIRUS 2 (TAT 6-24 HRS): SARS Coronavirus 2: NEGATIVE

## 2020-08-16 ENCOUNTER — Other Ambulatory Visit: Payer: Self-pay | Admitting: Critical Care Medicine

## 2020-08-16 ENCOUNTER — Other Ambulatory Visit: Payer: 59

## 2020-08-16 DIAGNOSIS — Z20822 Contact with and (suspected) exposure to covid-19: Secondary | ICD-10-CM

## 2020-08-17 ENCOUNTER — Telehealth: Payer: Self-pay | Admitting: *Deleted

## 2020-08-17 LAB — NOVEL CORONAVIRUS, NAA: SARS-CoV-2, NAA: DETECTED — AB

## 2020-08-17 LAB — SARS-COV-2, NAA 2 DAY TAT

## 2020-08-17 NOTE — Telephone Encounter (Signed)
Pt calling for covid result, pending, not resulted as of yet. Pt verbalizes understanding.

## 2021-01-28 ENCOUNTER — Other Ambulatory Visit: Payer: Self-pay | Admitting: Orthopedic Surgery

## 2021-02-18 ENCOUNTER — Other Ambulatory Visit
Admission: RE | Admit: 2021-02-18 | Discharge: 2021-02-18 | Disposition: A | Discharge status: Home / Self Care | Payer: 59 | Source: Ambulatory Visit | Attending: Orthopedic Surgery | Admitting: Orthopedic Surgery

## 2021-02-18 ENCOUNTER — Other Ambulatory Visit: Payer: Self-pay

## 2021-02-18 DIAGNOSIS — Z01818 Encounter for other preprocedural examination: Secondary | ICD-10-CM | POA: Diagnosis not present

## 2021-02-18 HISTORY — DX: COVID-19: U07.1

## 2021-02-18 HISTORY — DX: Malignant (primary) neoplasm, unspecified: C80.1

## 2021-02-18 NOTE — Patient Instructions (Signed)
Your procedure is scheduled on: 02/25/21 - Friday Report to the Registration Desk on the 1st floor of the [facility]. To find out your arrival time, please call [phone] between 1PM - 3PM on: 02/24/21- Thursday  REMEMBER: Instructions that are not followed completely may result in serious medical risk, up to and including death; or upon the discretion of your surgeon and anesthesiologist your surgery may need to be rescheduled.  Do not eat food after midnight the night before surgery.  No gum chewing, lozengers or hard candies.  You may however, drink CLEAR liquids up to 2 hours before you are scheduled to arrive for your surgery. Do not drink anything within 2 hours of your scheduled arrival time.  Clear liquids include: - water  - apple juice without pulp - gatorade (not RED, PURPLE, OR BLUE) - black coffee or tea (Do NOT add milk or creamers to the coffee or tea) Do NOT drink anything that is not on this list.  In addition, your doctor has ordered for you to drink the provided  Ensure Pre-Surgery Clear Carbohydrate Drink Drinking this carbohydrate drink up to two hours before surgery helps to reduce insulin resistance and improve patient outcomes. Please complete drinking 2 hours prior to scheduled arrival time.  TAKE THESE MEDICATIONS THE MORNING OF SURGERY WITH A SIP OF WATER: - amphetamine-dextroamphetamine (ADDERALL XR) 15 MG 24 hr capsule - buPROPion (WELLBUTRIN XL) 300 MG 24 hr tablet - busPIRone (BUSPAR) 15 MG tablet - cetirizine (ZYRTEC) 10 MG tablet - omeprazole (PRILOSEC) 20 MG capsule, take one the night before and one on the morning of surgery - helps to prevent nausea after surgery.  Use inhaler albuterol (PROVENTIL HFA;VENTOLIN HFA) 108 (90 Base) MCG/ACT inhalers on the day of surgery and bring to the hospital.  One week prior to surgery: Stop Anti-inflammatories (NSAIDS) such as Advil, Aleve, Ibuprofen, Motrin, Naproxen, Naprosyn and Aspirin based products  such as Excedrin, Goodys Powder, BC Powder.  Stop ANY OVER THE COUNTER supplements until after surgery.  No Alcohol for 24 hours before or after surgery.  No Smoking including e-cigarettes for 24 hours prior to surgery.  No chewable tobacco products for at least 6 hours prior to surgery.  No nicotine patches on the day of surgery.  Do not use any "recreational" drugs for at least a week prior to your surgery.  Please be advised that the combination of cocaine and anesthesia may have negative outcomes, up to and including death. If you test positive for cocaine, your surgery will be cancelled.  On the morning of surgery brush your teeth with toothpaste and water, you may rinse your mouth with mouthwash if you wish. Do not swallow any toothpaste or mouthwash.  Do not wear jewelry, make-up, hairpins, clips or nail polish.  Do not wear lotions, powders, or perfumes.   Do not shave body from the neck down 48 hours prior to surgery just in case you cut yourself which could leave a site for infection.  Also, freshly shaved skin may become irritated if using the CHG soap.  Contact lenses, hearing aids and dentures may not be worn into surgery.  Do not bring valuables to the hospital. [facility] is not responsible for any missing/lost belongings or valuables.   Use CHG Soap or wipes as directed on instruction sheet.  Notify your doctor if there is any change in your medical condition (cold, fever, infection).  Wear comfortable clothing (specific to your surgery type) to the hospital.  Plan  for stool softeners for home use; pain medications have a tendency to cause constipation. You can also help prevent constipation by eating foods high in fiber such as fruits and vegetables and drinking plenty of fluids as your diet allows.  After surgery, you can help prevent lung complications by doing breathing exercises.  Take deep breaths and cough every 1-2 hours. Your doctor may order a device  called an Incentive Spirometer to help you take deep breaths. When coughing or sneezing, hold a pillow firmly against your incision with both hands. This is called "splinting." Doing this helps protect your incision. It also decreases belly discomfort.  If you are being admitted to the hospital overnight, leave your suitcase in the car. After surgery it may be brought to your room.  If you are being discharged the day of surgery, you will not be allowed to drive home. You will need a responsible adult (18 years or older) to drive you home and stay with you that night.   If you are taking public transportation, you will need to have a responsible adult (18 years or older) with you. Please confirm with your physician that it is acceptable to use public transportation.   Please call the [place] [facility] at [phone] if you have any questions about these instructions.  Surgery Visitation Policy:  Patients undergoing a surgery or procedure may have one family member or support person with them as long as that person is not COVID-19 positive or experiencing its symptoms.  That person may remain in the waiting area during the procedure.  Inpatient Visitation:    Visiting hours are 7 a.m. to 8 p.m. Inpatients will be allowed two visitors daily. The visitors may change each day during the patient's stay. No visitors under the age of 39. Any visitor under the age of 27 must be accompanied by an adult. The visitor must pass COVID-19 screenings, use hand sanitizer when entering and exiting the patient's room and wear a mask at all times, including in the patient's room. Patients must also wear a mask when staff or their visitor are in the room. Masking is required regardless of vaccination status.

## 2021-02-23 ENCOUNTER — Other Ambulatory Visit: Payer: Self-pay

## 2021-02-23 ENCOUNTER — Other Ambulatory Visit
Admission: RE | Admit: 2021-02-23 | Discharge: 2021-02-23 | Disposition: A | Discharge status: Home / Self Care | Payer: 59 | Source: Ambulatory Visit | Attending: Orthopedic Surgery | Admitting: Orthopedic Surgery

## 2021-02-23 DIAGNOSIS — Z20822 Contact with and (suspected) exposure to covid-19: Secondary | ICD-10-CM | POA: Diagnosis not present

## 2021-02-23 DIAGNOSIS — Z01812 Encounter for preprocedural laboratory examination: Secondary | ICD-10-CM | POA: Diagnosis not present

## 2021-02-23 LAB — SARS CORONAVIRUS 2 (TAT 6-24 HRS): SARS Coronavirus 2: NEGATIVE

## 2021-02-24 MED ORDER — ORAL CARE MOUTH RINSE: 15.0000 mL | Freq: Once | OROMUCOSAL | Status: AC

## 2021-02-24 MED ORDER — CHLORHEXIDINE GLUCONATE 0.12 % MT SOLN: 15.0000 mL | Freq: Once | OROMUCOSAL | Status: AC

## 2021-02-24 MED ORDER — CEFAZOLIN SODIUM-DEXTROSE 2-4 GM/100ML-% IV SOLN
2.0000 g | INTRAVENOUS | Status: AC
  Administered 2021-02-25: 2 g via INTRAVENOUS

## 2021-02-24 MED ORDER — LACTATED RINGERS IV SOLN: INTRAVENOUS | Status: DC

## 2021-02-25 ENCOUNTER — Ambulatory Visit: Payer: 59 | Admitting: Urgent Care

## 2021-02-25 ENCOUNTER — Encounter: Payer: Self-pay | Admitting: Orthopedic Surgery

## 2021-02-25 ENCOUNTER — Ambulatory Visit
Admission: RE | Admit: 2021-02-25 | Discharge: 2021-02-25 | Disposition: A | Discharge status: Home / Self Care | Payer: 59 | Source: Ambulatory Visit | Attending: Orthopedic Surgery | Admitting: Orthopedic Surgery

## 2021-02-25 ENCOUNTER — Encounter
Admission: RE | Disposition: A | Discharge status: Home / Self Care | Payer: Self-pay | Source: Ambulatory Visit | Attending: Orthopedic Surgery

## 2021-02-25 ENCOUNTER — Other Ambulatory Visit: Payer: Self-pay

## 2021-02-25 DIAGNOSIS — Z79899 Other long term (current) drug therapy: Secondary | ICD-10-CM | POA: Diagnosis not present

## 2021-02-25 DIAGNOSIS — M67432 Ganglion, left wrist: Secondary | ICD-10-CM | POA: Diagnosis not present

## 2021-02-25 HISTORY — PX: GANGLION CYST EXCISION: SHX1691

## 2021-02-25 LAB — POCT PREGNANCY, URINE: Preg Test, Ur: NEGATIVE

## 2021-02-25 SURGERY — EXCISION, GANGLION CYST, WRIST
Anesthesia: General | Site: Wrist | Laterality: Left

## 2021-02-25 MED ORDER — PROPOFOL 10 MG/ML IV BOLUS
INTRAVENOUS | Status: AC
  Filled 2021-02-25: qty 20

## 2021-02-25 MED ORDER — CHLORHEXIDINE GLUCONATE 0.12 % MT SOLN
OROMUCOSAL | Status: AC
  Administered 2021-02-25: 15 mL via OROMUCOSAL
  Filled 2021-02-25: qty 15

## 2021-02-25 MED ORDER — OXYCODONE HCL 5 MG PO TABS
5.0000 mg | ORAL_TABLET | Freq: Once | ORAL | Status: DC | PRN
Start: 2021-02-25 — End: 2021-02-25

## 2021-02-25 MED ORDER — GLYCOPYRROLATE 0.2 MG/ML IJ SOLN
INTRAMUSCULAR | Status: AC
  Filled 2021-02-25: qty 1

## 2021-02-25 MED ORDER — BUPIVACAINE HCL 0.5 % IJ SOLN
INTRAMUSCULAR | Status: DC | PRN
  Administered 2021-02-25: 8 mL

## 2021-02-25 MED ORDER — MIDAZOLAM HCL 5 MG/5ML IJ SOLN
INTRAMUSCULAR | Status: DC | PRN
  Administered 2021-02-25: 2 mg via INTRAVENOUS

## 2021-02-25 MED ORDER — HYDROCODONE-ACETAMINOPHEN 5-325 MG PO TABS: 1.0000 | ORAL_TABLET | Freq: Four times a day (QID) | ORAL | 0 refills | Status: DC | PRN

## 2021-02-25 MED ORDER — PROPOFOL 10 MG/ML IV BOLUS
INTRAVENOUS | Status: DC | PRN
  Administered 2021-02-25: 160 mg via INTRAVENOUS

## 2021-02-25 MED ORDER — PROMETHAZINE HCL 25 MG/ML IJ SOLN: 6.2500 mg | INTRAMUSCULAR | Status: DC | PRN

## 2021-02-25 MED ORDER — METOCLOPRAMIDE HCL 10 MG PO TABS
5.0000 mg | ORAL_TABLET | Freq: Three times a day (TID) | ORAL | Status: DC | PRN
Start: 2021-02-25 — End: 2021-02-25

## 2021-02-25 MED ORDER — DEXMEDETOMIDINE (PRECEDEX) IN NS 20 MCG/5ML (4 MCG/ML) IV SYRINGE
PREFILLED_SYRINGE | INTRAVENOUS | Status: DC | PRN
  Administered 2021-02-25: 12 ug via INTRAVENOUS

## 2021-02-25 MED ORDER — ONDANSETRON HCL 4 MG/2ML IJ SOLN
INTRAMUSCULAR | Status: AC
  Filled 2021-02-25: qty 2

## 2021-02-25 MED ORDER — MIDAZOLAM HCL 2 MG/2ML IJ SOLN
INTRAMUSCULAR | Status: AC
  Filled 2021-02-25: qty 2

## 2021-02-25 MED ORDER — DEXAMETHASONE SODIUM PHOSPHATE 10 MG/ML IJ SOLN
INTRAMUSCULAR | Status: DC | PRN
  Administered 2021-02-25: 10 mg via INTRAVENOUS

## 2021-02-25 MED ORDER — DEXAMETHASONE SODIUM PHOSPHATE 10 MG/ML IJ SOLN
INTRAMUSCULAR | Status: AC
  Filled 2021-02-25: qty 1

## 2021-02-25 MED ORDER — ONDANSETRON HCL 4 MG PO TABS: 4.0000 mg | ORAL_TABLET | Freq: Four times a day (QID) | ORAL | Status: DC | PRN

## 2021-02-25 MED ORDER — FENTANYL CITRATE (PF) 100 MCG/2ML IJ SOLN
INTRAMUSCULAR | Status: AC
  Filled 2021-02-25: qty 2

## 2021-02-25 MED ORDER — CEFAZOLIN SODIUM-DEXTROSE 2-4 GM/100ML-% IV SOLN
INTRAVENOUS | Status: AC
  Filled 2021-02-25: qty 100

## 2021-02-25 MED ORDER — LIDOCAINE HCL (PF) 2 % IJ SOLN
INTRAMUSCULAR | Status: DC | PRN
  Administered 2021-02-25: 50 mg

## 2021-02-25 MED ORDER — ONDANSETRON HCL 4 MG/2ML IJ SOLN
INTRAMUSCULAR | Status: DC | PRN
  Administered 2021-02-25: 4 mg via INTRAVENOUS

## 2021-02-25 MED ORDER — LIDOCAINE HCL (PF) 2 % IJ SOLN
INTRAMUSCULAR | Status: AC
  Filled 2021-02-25: qty 5

## 2021-02-25 MED ORDER — FENTANYL CITRATE (PF) 100 MCG/2ML IJ SOLN
INTRAMUSCULAR | Status: DC | PRN
  Administered 2021-02-25 (×2): 50 ug via INTRAVENOUS

## 2021-02-25 MED ORDER — BUPIVACAINE HCL (PF) 0.5 % IJ SOLN
INTRAMUSCULAR | Status: AC
  Filled 2021-02-25: qty 30

## 2021-02-25 MED ORDER — METOCLOPRAMIDE HCL 5 MG/ML IJ SOLN: 5.0000 mg | Freq: Three times a day (TID) | INTRAMUSCULAR | Status: DC | PRN

## 2021-02-25 MED ORDER — SODIUM CHLORIDE 0.9 % IV SOLN: INTRAVENOUS | Status: DC

## 2021-02-25 MED ORDER — ONDANSETRON HCL 4 MG/2ML IJ SOLN: 4.0000 mg | Freq: Four times a day (QID) | INTRAMUSCULAR | Status: DC | PRN

## 2021-02-25 MED ORDER — DEXMEDETOMIDINE (PRECEDEX) IN NS 20 MCG/5ML (4 MCG/ML) IV SYRINGE
PREFILLED_SYRINGE | INTRAVENOUS | Status: AC
  Filled 2021-02-25: qty 5

## 2021-02-25 MED ORDER — FENTANYL CITRATE (PF) 100 MCG/2ML IJ SOLN: 25.0000 ug | INTRAMUSCULAR | Status: DC | PRN

## 2021-02-25 MED ORDER — GLYCOPYRROLATE 0.2 MG/ML IJ SOLN
INTRAMUSCULAR | Status: DC | PRN
  Administered 2021-02-25: .2 mg via INTRAVENOUS

## 2021-02-25 MED ORDER — OXYCODONE HCL 5 MG/5ML PO SOLN: 5.0000 mg | Freq: Once | ORAL | Status: DC | PRN

## 2021-02-25 SURGICAL SUPPLY — 32 items
APL PRP STRL LF DISP 70% ISPRP (MISCELLANEOUS) ×1
BNDG CMPR STD VLCR NS LF 5.8X3 (GAUZE/BANDAGES/DRESSINGS) ×1
BNDG ELASTIC 3X5.8 VLCR NS LF (GAUZE/BANDAGES/DRESSINGS) ×2 IMPLANT
CANISTER SUCT 1200ML W/VALVE (MISCELLANEOUS) ×2 IMPLANT
CAST PADDING 3X4FT ST 30246 (SOFTGOODS) ×1
CHLORAPREP W/TINT 26 (MISCELLANEOUS) ×2 IMPLANT
COVER WAND RF STERILE (DRAPES) ×2 IMPLANT
CUFF TOURN SGL QUICK 18X4 (TOURNIQUET CUFF) IMPLANT
ELECT CAUTERY NEEDLE 2.0 MIC (NEEDLE) ×2 IMPLANT
GAUZE SPONGE 4X4 12PLY STRL (GAUZE/BANDAGES/DRESSINGS) ×2 IMPLANT
GAUZE XEROFORM 1X8 LF (GAUZE/BANDAGES/DRESSINGS) ×2 IMPLANT
GLOVE SURG SYN 9.0  PF PI (GLOVE) ×1
GLOVE SURG SYN 9.0 PF PI (GLOVE) ×1 IMPLANT
GOWN SRG 2XL LVL 4 RGLN SLV (GOWNS) ×1 IMPLANT
GOWN STRL NON-REIN 2XL LVL4 (GOWNS) ×2
GOWN STRL REUS W/ TWL LRG LVL3 (GOWN DISPOSABLE) ×1 IMPLANT
GOWN STRL REUS W/TWL LRG LVL3 (GOWN DISPOSABLE) ×2
KIT TURNOVER KIT A (KITS) ×2 IMPLANT
MANIFOLD NEPTUNE II (INSTRUMENTS) ×2 IMPLANT
NS IRRIG 500ML POUR BTL (IV SOLUTION) ×2 IMPLANT
PACK EXTREMITY ARMC (MISCELLANEOUS) ×2 IMPLANT
PAD CAST CTTN 3X4 STRL (SOFTGOODS) ×1 IMPLANT
PADDING CAST COTTON 3X4 STRL (SOFTGOODS) ×1
SCALPEL PROTECTED #15 DISP (BLADE) ×4 IMPLANT
SPLINT CAST 1 STEP 3X12 (MISCELLANEOUS) ×2 IMPLANT
SUT ETHILON 4-0 (SUTURE) ×2
SUT ETHILON 4-0 FS2 18XMFL BLK (SUTURE) ×1
SUT ETHILON 5-0 FS-2 18 BLK (SUTURE) ×2 IMPLANT
SUT MNCRL 4-0 (SUTURE) ×2
SUT MNCRL 4-018XMFL (SUTURE) ×1
SUTURE ETHLN 4-0 FS2 18XMF BLK (SUTURE) ×1 IMPLANT
SUTURE MNCRL 4-018XMF (SUTURE) ×1 IMPLANT

## 2021-02-25 NOTE — Anesthesia Procedure Notes (Signed)
Procedure Name: LMA Insertion Performed by: Elis Sauber, CRNA Pre-anesthesia Checklist: Patient identified, Patient being monitored, Timeout performed, Emergency Drugs available and Suction available Patient Re-evaluated:Patient Re-evaluated prior to induction Oxygen Delivery Method: Circle system utilized Preoxygenation: Pre-oxygenation with 100% oxygen Induction Type: IV induction Ventilation: Mask ventilation without difficulty LMA: LMA inserted LMA Size: 3.5 Tube type: Oral Number of attempts: 1 Placement Confirmation: positive ETCO2 and breath sounds checked- equal and bilateral Tube secured with: Tape Dental Injury: Teeth and Oropharynx as per pre-operative assessment        

## 2021-02-25 NOTE — Op Note (Signed)
02/25/2021  8:16 AM  PATIENT:  Erika Barnes  30 y.o. female  PRE-OPERATIVE DIAGNOSIS:  Ganglion of left wrist M67.432  POST-OPERATIVE DIAGNOSIS:  Ganglion of left wrist M67.432  PROCEDURE:  Procedure(s): REMOVAL GANGLION OF WRIST (Left)  SURGEON: Laurene Footman, MD  ASSISTANTS: None  ANESTHESIA:   general  EBL:  Total I/O In: 500 [I.V.:400; IV Piggyback:100] Out: -   BLOOD ADMINISTERED:none  DRAINS: none   LOCAL MEDICATIONS USED:  MARCAINE     SPECIMEN:  Source of Specimen:  Dorsal wrist mass  DISPOSITION OF SPECIMEN:  PATHOLOGY  COUNTS:  YES  TOURNIQUET:   Total Tourniquet Time Documented: Forearm (Left) - 18 minutes Total: Forearm (Left) - 18 minutes   IMPLANTS: None  DICTATION: .Dragon Dictation patient was brought to the operating room and after adequate general anesthesia was obtained the left arm was prepped and draped in the usual sterile fashion.  Tourniquet was applied the upper forearm and after appropriate patient identification timeout procedures were completed tourniquet was raised to 150 mmHg.  Transverse incision was made directly over the mass.  Subcutaneous tissue was spread and the mass measured approximately centimeter half in diameter.  Subcutaneous tissue spread and the cyst was opened.  There is not the usual gelatinous fluid rather inflammatory tissue was present and this was excised in part and sent as specimen.  The edges of this were then removed and after flexing the wrist that did not appear to be any further mass present.  Extensor tendons were identified and there did not appear to be extensor tenosynovitis present.  After exploring the area and make sure there is no residual cyst present the wound was thoroughly irrigated and 8 cc of half percent Sensorcaine removal tray in the area of the incision.  With flexion of the wrist there was no apparent mass present.  The wound was then closed using simple erupted 4-0 nylon skin sutures followed  by application of a dressing of Xeroform 4 x 4's web roll volar splint to try to prevent recurrence followed by Ace wrap and letting down the tourniquet.  PLAN OF CARE: Discharge to home after PACU  PATIENT DISPOSITION:  PACU - hemodynamically stable.

## 2021-02-25 NOTE — Discharge Instructions (Addendum)
Keep arm elevated over the weekend. Ice to the back of the wrist may help with pain. Work on finger motion is much as you can. Pain medicine as directed. Call office if you are having problems.  AMBULATORY SURGERY  DISCHARGE INSTRUCTIONS   1) The drugs that you were given will stay in your system until tomorrow so for the next 24 hours you should not:  A) Drive an automobile B) Make any legal decisions C) Drink any alcoholic beverage   2) You may resume regular meals tomorrow.  Today it is better to start with liquids and gradually work up to solid foods.  You may eat anything you prefer, but it is better to start with liquids, then soup and crackers, and gradually work up to solid foods.   3) Please notify your doctor immediately if you have any unusual bleeding, trouble breathing, redness and pain at the surgery site, drainage, fever, or pain not relieved by medication.    4) Additional Instructions:        Please contact your physician with any problems or Same Day Surgery at [phone], Monday through Friday 6 am to 4 pm, or [place] [facility] number at [phone].

## 2021-02-25 NOTE — Transfer of Care (Signed)
Immediate Anesthesia Transfer of Care Note  Patient: Erika Barnes  Procedure(s) Performed: REMOVAL GANGLION OF WRIST (Left Wrist)  Patient Location: PACU  Anesthesia Type:General  Level of Consciousness: sedated  Airway & Oxygen Therapy: Patient Spontanous Breathing and Patient connected to face mask oxygen  Post-op Assessment: Report given to RN and Post -op Vital signs reviewed and stable  Post vital signs: Reviewed  Last Vitals:  Vitals Value Taken Time  BP 105/80 02/25/21 0813  Temp    Pulse 60 02/25/21 0814  Resp 12 02/25/21 0814  SpO2 100 % 02/25/21 0814  Vitals shown include unvalidated device data.  Last Pain:  Vitals:   02/25/21 0638  TempSrc: Tympanic  PainSc: 0-No pain      Patients Stated Pain Goal: 0 (76/39/43 2003)  Complications: No complications documented.
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
## 2021-02-28 ENCOUNTER — Encounter: Payer: Self-pay | Admitting: Orthopedic Surgery

## 2021-02-28 LAB — SURGICAL PATHOLOGY

## 2021-02-28 NOTE — Anesthesia Postprocedure Evaluation (Signed)
Anesthesia Post Note  Patient: Erika Barnes  Procedure(s) Performed: REMOVAL GANGLION OF WRIST (Left Wrist)  Patient location during evaluation: PACU Anesthesia Type: General Level of consciousness: awake and alert Pain management: pain level controlled Vital Signs Assessment: post-procedure vital signs reviewed and stable Respiratory status: spontaneous breathing, nonlabored ventilation, respiratory function stable and patient connected to nasal cannula oxygen Cardiovascular status: blood pressure returned to baseline and stable Postop Assessment: no apparent nausea or vomiting Anesthetic complications: no   No complications documented.   Last Vitals:  Vitals:   02/25/21 0845 02/25/21 0853  BP: 115/79 119/84  Pulse: 69 64  Resp: 18 18  Temp: (!) 36.3 C 36.7 C  SpO2: 100% 100%    Last Pain:  Vitals:   02/25/21 0853  TempSrc:   PainSc: 2                  Precious Haws Lazette Estala

## 2022-05-28 ENCOUNTER — Ambulatory Visit
Admission: EM | Admit: 2022-05-28 | Discharge: 2022-05-28 | Disposition: A | Discharge status: Home / Self Care | Payer: 59 | Attending: Emergency Medicine | Admitting: Emergency Medicine

## 2022-05-28 ENCOUNTER — Other Ambulatory Visit: Payer: Self-pay

## 2022-05-28 DIAGNOSIS — N3001 Acute cystitis with hematuria: Secondary | ICD-10-CM | POA: Diagnosis present

## 2022-05-28 DIAGNOSIS — N760 Acute vaginitis: Secondary | ICD-10-CM | POA: Insufficient documentation

## 2022-05-28 DIAGNOSIS — B9689 Other specified bacterial agents as the cause of diseases classified elsewhere: Secondary | ICD-10-CM | POA: Diagnosis present

## 2022-05-28 LAB — URINALYSIS, ROUTINE W REFLEX MICROSCOPIC
Bilirubin Urine: NEGATIVE
Glucose, UA: NEGATIVE mg/dL
Ketones, ur: NEGATIVE mg/dL
Nitrite: NEGATIVE
Protein, ur: >300 mg/dL — AB
Specific Gravity, Urine: 1.025 (ref 1.005–1.030)
pH: 7 (ref 5.0–8.0)

## 2022-05-28 LAB — WET PREP, GENITAL
Sperm: NONE SEEN
Trich, Wet Prep: NONE SEEN
WBC, Wet Prep HPF POC: <10 (ref <10)
Yeast Wet Prep HPF POC: NONE SEEN

## 2022-05-28 LAB — URINALYSIS, MICROSCOPIC (REFLEX)
RBC / HPF: >50 RBC/hpf (ref 0–5)
WBC, UA: >50 WBC/hpf (ref 0–5)

## 2022-05-28 MED ORDER — PHENAZOPYRIDINE HCL 200 MG PO TABS: 200.0000 mg | ORAL_TABLET | Freq: Three times a day (TID) | ORAL | 0 refills | Status: DC | PRN

## 2022-05-28 MED ORDER — NITROFURANTOIN MONOHYD MACRO 100 MG PO CAPS
100.0000 mg | ORAL_CAPSULE | Freq: Two times a day (BID) | ORAL | 0 refills | Status: AC
Start: 2022-05-28 — End: 2022-06-02

## 2022-05-28 MED ORDER — METRONIDAZOLE 500 MG PO TABS: 500.0000 mg | ORAL_TABLET | Freq: Two times a day (BID) | ORAL | 0 refills | Status: AC

## 2022-05-28 NOTE — ED Triage Notes (Signed)
X 1-2 days, Burning, frequent urination, lower abdominal pain, no itching, brown discharge

## 2022-05-28 NOTE — Discharge Instructions (Addendum)
You have a urinary tract infection.  I am sending your urine off for culture to make sure that we have you on the right antibiotic.  Finish the [employer], even if you feel better.  The Pyridium will help with your symptoms.  You also have bacterial vaginosis, which is not an STD.  It is treated with Flagyl.  Make sure you finish the Flagyl even if you feel better.  No intercourse until your symptoms have resolved.  No alcohol while taking Flagyl and for 3 days after finishing it.
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
## 2022-05-31 LAB — URINE CULTURE: Culture: 20000 — AB

## 2023-02-13 ENCOUNTER — Ambulatory Visit
Admission: EM | Admit: 2023-02-13 | Discharge: 2023-02-13 | Disposition: A | Discharge status: Home / Self Care | Payer: 59 | Attending: Emergency Medicine | Admitting: Emergency Medicine

## 2023-02-13 DIAGNOSIS — N390 Urinary tract infection, site not specified: Secondary | ICD-10-CM

## 2023-02-13 LAB — URINALYSIS, W/ REFLEX TO CULTURE (INFECTION SUSPECTED): RBC / HPF: >50 RBC/hpf (ref 0–5)

## 2023-02-13 MED ORDER — CEPHALEXIN 500 MG PO CAPS: 500.0000 mg | ORAL_CAPSULE | Freq: Two times a day (BID) | ORAL | 0 refills | Status: AC

## 2023-02-13 MED ORDER — PHENAZOPYRIDINE HCL 200 MG PO TABS: 200.0000 mg | ORAL_TABLET | Freq: Three times a day (TID) | ORAL | 0 refills | Status: AC
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
## 2023-02-13 NOTE — ED Triage Notes (Signed)
Patient presents to [facility] for possible UTI. Reports lower abdominal pain, urinary freq, dysuria x 3 days ago. Not taking any OTC meds.

## 2023-02-13 NOTE — Discharge Instructions (Signed)
Take the Keflex twice daily for 5 days with food for treatment of urinary tract infection.  Use the Pyridium every 8 hours as needed for urinary discomfort.  This will turn your urine a bright red-orange.  Increase your oral fluid intake so that you increase your urine production and or flushing your urinary system.  Take an over-the-counter probiotic, such as Culturelle-Align-Activia, 1 hour after each dose of antibiotic to prevent diarrhea or yeast infections from forming.  We will culture urine and change the antibiotics if necessary.  Return for reevaluation, or see your primary care provider, for any new or worsening symptoms.  

## 2023-02-15 LAB — URINE CULTURE
Culture: >=100000 — AB
Special Requests: NORMAL

## 2024-02-08 ENCOUNTER — Encounter: Payer: Self-pay | Admitting: Emergency Medicine

## 2024-02-08 ENCOUNTER — Ambulatory Visit
Admission: EM | Admit: 2024-02-08 | Discharge: 2024-02-08 | Disposition: A | Discharge status: Home / Self Care | Attending: Internal Medicine | Admitting: Internal Medicine

## 2024-02-08 DIAGNOSIS — M791 Myalgia, unspecified site: Secondary | ICD-10-CM | POA: Diagnosis present

## 2024-02-08 DIAGNOSIS — J029 Acute pharyngitis, unspecified: Secondary | ICD-10-CM

## 2024-02-08 LAB — RESP PANEL BY RT-PCR (FLU A&B, COVID) ARPGX2
Influenza A by PCR: NEGATIVE
Influenza B by PCR: NEGATIVE
SARS Coronavirus 2 by RT PCR: NEGATIVE

## 2024-02-08 LAB — GROUP A STREP BY PCR: Group A Strep by PCR: NOT DETECTED

## 2024-02-08 NOTE — Discharge Instructions (Signed)
 Come back if you develop a fever, lots of runny nose and cough for re-testing

## 2024-02-08 NOTE — ED Triage Notes (Signed)
 Patient c/o bodyaches, sore throat and headaches that started yesterday.  Patient denies fevers.
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
